# Patient Record
Sex: Male | Born: 1935 | Race: White | Hispanic: No | Smoking: Never smoker
Health system: Southern US, Community
[De-identification: ages and names within clinical notes are randomized; demographics above are authoritative.]

## PROBLEM LIST (undated history)

## (undated) DIAGNOSIS — R972 Elevated prostate specific antigen [PSA]: Secondary | ICD-10-CM

## (undated) DIAGNOSIS — E785 Hyperlipidemia, unspecified: Secondary | ICD-10-CM

## (undated) DIAGNOSIS — G459 Transient cerebral ischemic attack, unspecified: Secondary | ICD-10-CM

## (undated) DIAGNOSIS — N400 Enlarged prostate without lower urinary tract symptoms: Secondary | ICD-10-CM

## (undated) DIAGNOSIS — I4891 Unspecified atrial fibrillation: Secondary | ICD-10-CM

## (undated) DIAGNOSIS — C44300 Unspecified malignant neoplasm of skin of unspecified part of face: Secondary | ICD-10-CM

## (undated) DIAGNOSIS — G629 Polyneuropathy, unspecified: Secondary | ICD-10-CM

## (undated) DIAGNOSIS — R351 Nocturia: Secondary | ICD-10-CM

## (undated) DIAGNOSIS — H579 Unspecified disorder of eye and adnexa: Secondary | ICD-10-CM

## (undated) DIAGNOSIS — G309 Alzheimer's disease, unspecified: Secondary | ICD-10-CM

## (undated) DIAGNOSIS — M1990 Unspecified osteoarthritis, unspecified site: Secondary | ICD-10-CM

## (undated) DIAGNOSIS — N419 Inflammatory disease of prostate, unspecified: Secondary | ICD-10-CM

## (undated) DIAGNOSIS — M109 Gout, unspecified: Secondary | ICD-10-CM

## (undated) DIAGNOSIS — M5030 Other cervical disc degeneration, unspecified cervical region: Secondary | ICD-10-CM

## (undated) DIAGNOSIS — R32 Unspecified urinary incontinence: Secondary | ICD-10-CM

## (undated) DIAGNOSIS — F0280 Dementia in other diseases classified elsewhere without behavioral disturbance: Secondary | ICD-10-CM

## (undated) DIAGNOSIS — G4730 Sleep apnea, unspecified: Secondary | ICD-10-CM

## (undated) DIAGNOSIS — C61 Malignant neoplasm of prostate: Secondary | ICD-10-CM

## (undated) HISTORY — DX: Gout, unspecified: M10.9

## (undated) HISTORY — DX: Dementia in other diseases classified elsewhere, unspecified severity, without behavioral disturbance, psychotic disturbance, mood disturbance, and anxiety: F02.80

## (undated) HISTORY — DX: Inflammatory disease of prostate, unspecified: N41.9

## (undated) HISTORY — DX: Nocturia: R35.1

## (undated) HISTORY — DX: Sleep apnea, unspecified: G47.30

## (undated) HISTORY — DX: Unspecified osteoarthritis, unspecified site: M19.90

## (undated) HISTORY — DX: Malignant neoplasm of prostate: C61

## (undated) HISTORY — DX: Transient cerebral ischemic attack, unspecified: G45.9

## (undated) HISTORY — DX: Unspecified disorder of eye and adnexa: H57.9

## (undated) HISTORY — DX: Unspecified atrial fibrillation: I48.91

## (undated) HISTORY — DX: Hyperlipidemia, unspecified: E78.5

## (undated) HISTORY — DX: Unspecified malignant neoplasm of skin of unspecified part of face: C44.300

## (undated) HISTORY — DX: Unspecified urinary incontinence: R32

## (undated) HISTORY — DX: Benign prostatic hyperplasia without lower urinary tract symptoms: N40.0

## (undated) HISTORY — PX: HERNIA REPAIR: SHX51

## (undated) HISTORY — DX: Polyneuropathy, unspecified: G62.9

## (undated) HISTORY — DX: Other cervical disc degeneration, unspecified cervical region: M50.30

## (undated) HISTORY — DX: Elevated prostate specific antigen (PSA): R97.20

## (undated) HISTORY — DX: Alzheimer's disease, unspecified: G30.9

---

## 1991-03-31 HISTORY — PX: TRANSURETHRAL RESECTION OF PROSTATE: SHX73

## 2003-07-04 ENCOUNTER — Other Ambulatory Visit: Payer: Self-pay

## 2003-11-20 ENCOUNTER — Other Ambulatory Visit: Payer: Self-pay

## 2004-02-25 ENCOUNTER — Ambulatory Visit: Payer: Self-pay | Admitting: Ophthalmology

## 2004-03-03 ENCOUNTER — Ambulatory Visit: Payer: Self-pay | Admitting: Ophthalmology

## 2004-06-20 ENCOUNTER — Ambulatory Visit: Payer: Self-pay | Admitting: Ophthalmology

## 2004-06-30 ENCOUNTER — Ambulatory Visit: Payer: Self-pay | Admitting: Ophthalmology

## 2007-02-07 ENCOUNTER — Ambulatory Visit: Payer: Self-pay | Admitting: Family Medicine

## 2007-02-21 ENCOUNTER — Ambulatory Visit: Payer: Self-pay | Admitting: Family Medicine

## 2007-05-19 ENCOUNTER — Ambulatory Visit: Payer: Self-pay | Admitting: Rheumatology

## 2007-12-22 ENCOUNTER — Ambulatory Visit: Payer: Self-pay | Admitting: Family Medicine

## 2010-05-02 ENCOUNTER — Observation Stay: Payer: Self-pay | Admitting: Internal Medicine

## 2013-03-30 HISTORY — PX: MOHS SURGERY: SUR867

## 2013-05-31 ENCOUNTER — Ambulatory Visit: Payer: Self-pay | Admitting: Urology

## 2013-08-17 DIAGNOSIS — G909 Disorder of the autonomic nervous system, unspecified: Secondary | ICD-10-CM | POA: Insufficient documentation

## 2013-09-14 LAB — LIPID PANEL
Cholesterol: 156 mg/dL (ref 0–200)
HDL: 67 mg/dL (ref 35–70)
LDL Cholesterol: 74 mg/dL
TRIGLYCERIDES: 77 mg/dL (ref 40–160)

## 2013-09-14 LAB — HEMOGLOBIN A1C: Hgb A1c MFr Bld: 6.4 % — AB (ref 4.0–6.0)

## 2014-04-02 LAB — BASIC METABOLIC PANEL
BUN: 39 mg/dL — AB (ref 4–21)
Creatinine: 1.3 mg/dL (ref <=1.3)
GLUCOSE: 110 mg/dL
Potassium: 5.2 mmol/L (ref 3.4–5.3)
Sodium: 139 mmol/L (ref 137–147)

## 2014-04-02 LAB — HEPATIC FUNCTION PANEL
ALK PHOS: 84 U/L (ref 25–125)
ALT: 16 U/L (ref 10–40)
AST: 21 U/L (ref 14–40)
Bilirubin, Total: 0.3 mg/dL

## 2014-04-02 LAB — CBC AND DIFFERENTIAL
HCT: 42 % (ref 41–53)
Hemoglobin: 13.8 g/dL (ref 13.5–17.5)
NEUTROS ABS: 64 /uL
PLATELETS: 177 10*3/uL (ref 150–399)
WBC: 7.4 10^3/mL

## 2014-04-02 LAB — TSH: TSH: 2.68 u[IU]/mL (ref <=5.90)

## 2014-04-08 ENCOUNTER — Emergency Department: Payer: Self-pay | Admitting: Internal Medicine

## 2014-04-27 DIAGNOSIS — I482 Chronic atrial fibrillation, unspecified: Secondary | ICD-10-CM | POA: Insufficient documentation

## 2014-08-02 LAB — POCT INR: INR: 1.9 — AB (ref <=1.1)

## 2014-08-07 ENCOUNTER — Other Ambulatory Visit: Payer: Self-pay | Admitting: Family Medicine

## 2014-08-07 DIAGNOSIS — Z79899 Other long term (current) drug therapy: Secondary | ICD-10-CM

## 2014-08-09 ENCOUNTER — Ambulatory Visit
Admission: RE | Admit: 2014-08-09 | Discharge: 2014-08-09 | Disposition: A | Discharge status: Home / Self Care | Payer: Medicare PPO | Source: Ambulatory Visit | Attending: Family Medicine | Admitting: Family Medicine

## 2014-08-09 DIAGNOSIS — C61 Malignant neoplasm of prostate: Secondary | ICD-10-CM | POA: Insufficient documentation

## 2014-08-09 DIAGNOSIS — C4491 Basal cell carcinoma of skin, unspecified: Secondary | ICD-10-CM | POA: Insufficient documentation

## 2014-08-09 DIAGNOSIS — J309 Allergic rhinitis, unspecified: Secondary | ICD-10-CM | POA: Insufficient documentation

## 2014-08-09 DIAGNOSIS — C439 Malignant melanoma of skin, unspecified: Secondary | ICD-10-CM | POA: Insufficient documentation

## 2014-08-09 DIAGNOSIS — R2681 Unsteadiness on feet: Secondary | ICD-10-CM | POA: Insufficient documentation

## 2014-08-09 DIAGNOSIS — G609 Hereditary and idiopathic neuropathy, unspecified: Secondary | ICD-10-CM | POA: Insufficient documentation

## 2014-08-09 DIAGNOSIS — Z79899 Other long term (current) drug therapy: Secondary | ICD-10-CM | POA: Insufficient documentation

## 2014-08-09 DIAGNOSIS — I6782 Cerebral ischemia: Secondary | ICD-10-CM | POA: Insufficient documentation

## 2014-08-09 DIAGNOSIS — M9979 Connective tissue and disc stenosis of intervertebral foramina of abdomen and other regions: Secondary | ICD-10-CM | POA: Insufficient documentation

## 2014-08-09 DIAGNOSIS — G4730 Sleep apnea, unspecified: Secondary | ICD-10-CM | POA: Insufficient documentation

## 2014-08-09 DIAGNOSIS — R0902 Hypoxemia: Secondary | ICD-10-CM | POA: Insufficient documentation

## 2014-08-09 DIAGNOSIS — E7800 Pure hypercholesterolemia, unspecified: Secondary | ICD-10-CM | POA: Insufficient documentation

## 2014-08-09 DIAGNOSIS — I4891 Unspecified atrial fibrillation: Secondary | ICD-10-CM | POA: Insufficient documentation

## 2014-08-09 DIAGNOSIS — E538 Deficiency of other specified B group vitamins: Secondary | ICD-10-CM | POA: Insufficient documentation

## 2014-08-09 DIAGNOSIS — M8580 Other specified disorders of bone density and structure, unspecified site: Secondary | ICD-10-CM | POA: Diagnosis not present

## 2014-08-09 DIAGNOSIS — D126 Benign neoplasm of colon, unspecified: Secondary | ICD-10-CM | POA: Insufficient documentation

## 2014-08-09 DIAGNOSIS — N400 Enlarged prostate without lower urinary tract symptoms: Secondary | ICD-10-CM | POA: Insufficient documentation

## 2014-08-09 DIAGNOSIS — G939 Disorder of brain, unspecified: Secondary | ICD-10-CM | POA: Insufficient documentation

## 2014-08-09 DIAGNOSIS — M1990 Unspecified osteoarthritis, unspecified site: Secondary | ICD-10-CM | POA: Insufficient documentation

## 2014-09-17 ENCOUNTER — Telehealth: Payer: Self-pay | Admitting: Family Medicine

## 2014-09-17 NOTE — Telephone Encounter (Signed)
[name] have you seen this on the patient?-aa

## 2014-09-17 NOTE — Telephone Encounter (Signed)
Pt's daughter Pamala Hurry would like to get the results of Pt's PT Test that was done on 09/05/14. Daughter stated she nor pt had got the results yet. Thanks TNP

## 2014-09-18 ENCOUNTER — Telehealth: Payer: Self-pay | Admitting: Family Medicine

## 2014-09-18 NOTE — Telephone Encounter (Signed)
Ivin Booty advised the lab slip is ready.-aa

## 2014-09-18 NOTE — Telephone Encounter (Signed)
Pt's daughter Ivin Booty would like to pick up orders for pt to have his PT check. She would like to pick up the orders this afternoon to take him Thursday. I advised they might not be ready by this afternoon. Ivin Booty would like a nurse to return her call. Thanks TNP

## 2014-09-19 ENCOUNTER — Encounter: Payer: Self-pay | Admitting: Family Medicine

## 2014-09-25 NOTE — Telephone Encounter (Signed)
Pt called to request lab results.  CB#[phone]/MJ

## 2014-09-25 NOTE — Telephone Encounter (Signed)
Spoke with Dr. Venia Minks and she reviewed his current INR result which is at 1.8 was 1.7 in May. Advised to take coumadin MWF 2 mg and all other day 1 mg, re check in 2 weeks. Patient wrote this down and understood.

## 2014-09-26 ENCOUNTER — Encounter: Payer: Self-pay | Admitting: Family Medicine

## 2014-09-27 ENCOUNTER — Telehealth: Payer: Self-pay | Admitting: Family Medicine

## 2014-09-27 NOTE — Telephone Encounter (Signed)
Pamala Hurry advised, see other message in the chart=aa

## 2014-09-27 NOTE — Telephone Encounter (Signed)
Pt's daughter would like a call back to get the lab results from 09/20/14. Thanks TNP

## 2014-10-08 ENCOUNTER — Ambulatory Visit: Payer: Self-pay | Admitting: Family Medicine

## 2014-10-09 ENCOUNTER — Other Ambulatory Visit: Payer: Self-pay | Admitting: Neurology

## 2014-10-09 DIAGNOSIS — R410 Disorientation, unspecified: Secondary | ICD-10-CM

## 2014-10-10 ENCOUNTER — Other Ambulatory Visit: Payer: Self-pay | Admitting: Family Medicine

## 2014-10-11 LAB — PROTIME-INR
INR: 1.4 — AB (ref 0.8–1.2)
Prothrombin Time: 14.3 s — ABNORMAL HIGH (ref 9.1–12.0)

## 2014-10-15 ENCOUNTER — Telehealth: Payer: Self-pay | Admitting: Family Medicine

## 2014-10-15 NOTE — Progress Notes (Signed)
Pt advised and daughter-Barbara advised-aa

## 2014-10-15 NOTE — Telephone Encounter (Signed)
Pt wants Lab results from his pt test.  Please call back .[phone]  Thanks  tp

## 2014-10-18 ENCOUNTER — Ambulatory Visit
Admission: RE | Admit: 2014-10-18 | Discharge: 2014-10-18 | Disposition: A | Discharge status: Home / Self Care | Payer: Medicare PPO | Source: Ambulatory Visit | Attending: Neurology | Admitting: Neurology

## 2014-10-18 DIAGNOSIS — F0280 Dementia in other diseases classified elsewhere without behavioral disturbance: Secondary | ICD-10-CM | POA: Insufficient documentation

## 2014-10-18 DIAGNOSIS — R410 Disorientation, unspecified: Secondary | ICD-10-CM

## 2014-10-18 DIAGNOSIS — G301 Alzheimer's disease with late onset: Secondary | ICD-10-CM | POA: Insufficient documentation

## 2014-10-18 DIAGNOSIS — I679 Cerebrovascular disease, unspecified: Secondary | ICD-10-CM | POA: Insufficient documentation

## 2014-10-18 MED ORDER — GADOBENATE DIMEGLUMINE 529 MG/ML IV SOLN
10.0000 mL | Freq: Once | INTRAVENOUS | Status: AC | PRN
  Administered 2014-10-18: 7 mL via INTRAVENOUS

## 2014-10-29 ENCOUNTER — Ambulatory Visit: Payer: Self-pay | Admitting: Family Medicine

## 2014-10-30 ENCOUNTER — Other Ambulatory Visit: Payer: Self-pay | Admitting: Family Medicine

## 2014-10-31 LAB — PROTIME-INR
INR: 2.7 — ABNORMAL HIGH (ref 0.8–1.2)
PROTHROMBIN TIME: 27.6 s — AB (ref 9.1–12.0)

## 2014-11-06 ENCOUNTER — Telehealth: Payer: Self-pay | Admitting: Family Medicine

## 2014-11-06 NOTE — Telephone Encounter (Signed)
Pt's daughter said that she was returning Elena's call and that she should be available for the rest of the afternoon for a call back. Thanks TNP

## 2014-11-06 NOTE — Telephone Encounter (Signed)
Pt informed and voiced understanding of results. 

## 2014-11-06 NOTE — Telephone Encounter (Signed)
-----   Message from Jerrol Banana., MD sent at 11/05/2014  9:46 AM EDT ----- Level good. Same dose. Repeat one month.

## 2014-11-06 NOTE — Telephone Encounter (Signed)
Spoke with pt daughter informed her of results.

## 2014-11-06 NOTE — Telephone Encounter (Signed)
LMTCB with daughter Pamala Hurry ED

## 2014-11-19 ENCOUNTER — Telehealth: Payer: Self-pay | Admitting: Family Medicine

## 2014-11-19 NOTE — Telephone Encounter (Signed)
Pt daughter Pamala Hurry called stating pt has a standing order to have lab work with [facility].  Pt daughter is requesting this order changed from [place]  to [email or web address]  [phone].  TI#[phone]/QP

## 2014-11-20 NOTE — Telephone Encounter (Signed)
ok 

## 2014-11-20 NOTE — Telephone Encounter (Signed)
[name] Do you want this to be done this way.  Is he still independent living or is it because that changed.  ED

## 2014-11-21 NOTE — Telephone Encounter (Signed)
Done and daughter Rhae Lerner

## 2014-11-22 ENCOUNTER — Telehealth: Payer: Self-pay | Admitting: Emergency Medicine

## 2014-11-22 ENCOUNTER — Encounter: Payer: Self-pay | Admitting: Family Medicine

## 2014-11-22 NOTE — Telephone Encounter (Signed)
Tried to call pt about warfarin dosage. Need to know what dosage he is taking and then increase warfarin by 2 mg a week. Pay attention to how pt is acting as far as memory. We may need to talk with daughter about the benefits of him taking this and his memory and the risk of him falling, taking too much etc. See notes on my desk.

## 2014-11-23 NOTE — Telephone Encounter (Signed)
Spoke with Pamala Hurry, Pt daughter. She reports that pt does better if we can keep him on a consistant dose. I told about maybe time to start discussing the risk and benefits of coming off the Warfarin. She wants to know if he can just keep taking this dose he is on even though it is a lower than target as some protection for his Afib. They are trying to keep him on routine because he does better when he does not have changes in anything. In other words just stay taking the 2 mg daily even though it is out of range for now until the time comes to do something different with his medication routine all together.

## 2014-11-24 NOTE — Telephone Encounter (Signed)
[place] but make appt for sometime this month.

## 2014-11-26 NOTE — Telephone Encounter (Signed)
Pamala Hurry, daughter, advised and appt made-aa

## 2014-11-27 ENCOUNTER — Telehealth: Payer: Self-pay | Admitting: Family Medicine

## 2014-11-27 NOTE — Telephone Encounter (Signed)
Spoke with Caryl Pina regarding the note below;  [place] had called pt's daughter Pamala Hurry regarding to continue the same  dose and will be following up in a month, Caryl Pina from [facility] is requesting to let her know when to recheck the PT/INR if when the pt comes for his appointment at [facility] or at the [facility]. And if at [facility] on his next appointment when will she be checking it?   Please advise..  Thanks,

## 2014-11-27 NOTE — Telephone Encounter (Signed)
Caryl Pina with [facility] called because she faxed over pt's PT/INR results last week and hasn't heard from our office if we wanted to make changes to his medication. PT/INR  19.4/1.64 on 11/22/14. Please advise when they need to recheck. Thanks TNP

## 2014-11-27 NOTE — Telephone Encounter (Signed)
Left message to inform Kerry [name] that we will be discussing options with family at appt on 12/10/14 with this medication and the risk and benefits of staying on it and the mental status of pt.

## 2014-11-28 ENCOUNTER — Encounter: Payer: Self-pay | Admitting: *Deleted

## 2014-11-30 ENCOUNTER — Other Ambulatory Visit: Payer: Self-pay | Admitting: Family Medicine

## 2014-11-30 ENCOUNTER — Telehealth: Payer: Self-pay | Admitting: Family Medicine

## 2014-11-30 DIAGNOSIS — I4891 Unspecified atrial fibrillation: Secondary | ICD-10-CM

## 2014-11-30 MED ORDER — WARFARIN SODIUM 2 MG PO TABS: 2.0000 mg | ORAL_TABLET | Freq: Once | ORAL | Status: DC

## 2014-11-30 NOTE — Telephone Encounter (Signed)
Pt advised, INR was 1.6 that was drawn through [facility] and daughter was advised also of results, will not make any changes per daughters request and will follow up on September 12th still-aa

## 2014-11-30 NOTE — Telephone Encounter (Signed)
Patient's last INR was 2.7 on 11/01/14. Patient has an appt scheduled with [name] on 12/10/14.

## 2014-11-30 NOTE — Telephone Encounter (Signed)
Pt is requesting lab results.  CB#[phone]/MW

## 2014-11-30 NOTE — Telephone Encounter (Signed)
RX sent in see other message-aa

## 2014-11-30 NOTE — Telephone Encounter (Signed)
Pt contacted office for refill request on the following medications:  warfarin (COUMADIN) 2 MG.  [facility].  CB#[phone]/Barbara/MW   Pt is completely out and is requesting this sent to the local pharmacy/MW  The is a pt of Dr Wilmon Arms

## 2014-12-10 ENCOUNTER — Ambulatory Visit (INDEPENDENT_AMBULATORY_CARE_PROVIDER_SITE_OTHER): Payer: Medicare PPO | Admitting: Urology

## 2014-12-10 ENCOUNTER — Ambulatory Visit (INDEPENDENT_AMBULATORY_CARE_PROVIDER_SITE_OTHER): Payer: Medicare PPO | Admitting: Family Medicine

## 2014-12-10 ENCOUNTER — Encounter: Payer: Self-pay | Admitting: Urology

## 2014-12-10 ENCOUNTER — Encounter: Payer: Self-pay | Admitting: Family Medicine

## 2014-12-10 VITALS — BP 117/61 | HR 63 | Ht 66.5 in | Wt 149.7 lb

## 2014-12-10 VITALS — BP 138/72 | HR 72 | Resp 16 | Wt 151.0 lb

## 2014-12-10 DIAGNOSIS — C61 Malignant neoplasm of prostate: Secondary | ICD-10-CM | POA: Diagnosis not present

## 2014-12-10 DIAGNOSIS — Z23 Encounter for immunization: Secondary | ICD-10-CM

## 2014-12-10 DIAGNOSIS — I4891 Unspecified atrial fibrillation: Secondary | ICD-10-CM | POA: Diagnosis not present

## 2014-12-10 DIAGNOSIS — C7951 Secondary malignant neoplasm of bone: Secondary | ICD-10-CM

## 2014-12-10 DIAGNOSIS — R413 Other amnesia: Secondary | ICD-10-CM | POA: Diagnosis not present

## 2014-12-10 NOTE — Progress Notes (Signed)
12/10/2014 8:55 AM   Kerry [name] 12-Oct-1918 [id number]  Referring provider: Jerrol Banana., MD [address] [place], [place] [address]  Chief Complaint  Patient presents with  . Prostate Cancer    4 month check up    HPI: Patient is a 81 year old white male with prostate cancer who is on intermittent ADT therapy. He initially presented with a PSA level of 61.7 ng/mL. His bone scan completed on 05/31/2013 noted a lesion in T4 which was suspicious for metastases. He underwent four injections of Firmagon. His last PSA was 0.4 ng/mL on 07/30/2014 and his last serum testosterone was 25 ng/dL on 07/30/2014. He is taking his calcium supplements and Vitamin D.  Patient underwent a DEXA scan on 08/09/2014 and was found to have osteopenia.    At the visit on 04/02/2014, Kerry [name] was having a fair amount of fatigue and he wasn't able to engage in the activities that he enjoys. We had a long discussion with him and his daughter and decided to stop the ADT therapy and see if his fatigue improved.   Over the last few months, he states his fatigue has improved.  He is able to play golf and spend time with his wife.  He is a little frustrated at this time because he recently sold his vehicle.  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     PMH: Past Medical History  Diagnosis Date  . Nocturia   . Neuropathy   . Incontinence   . Osteoarthritis   . Prostatitis   . Skin cancer of face   . Sleep apnea   . TIA (transient ischemic attack)   . HLD (hyperlipidemia)   . Gout   . Eye lesion   . DDD (degenerative disc disease), cervical   . BPH (benign prostatic hyperplasia)   . A-fib   . Prostate cancer   . Elevated PSA     Surgical History: Past Surgical History  Procedure Laterality Date  . Transurethral resection of prostate  1993    Dr. Eliberto Ivory  . Hernia repair    . Mohs surgery  2015    skin cancer surgery on  the face and neck    Home Medications:    Medication List       This list is accurate as of: 12/10/14  8:55 AM.  Always use your most recent med list.               calcium carbonate 600 MG Tabs tablet  Commonly known as:  OS-CAL  Take by mouth.     cyanocobalamin 1000 MCG tablet  Take by mouth.     folic acid 967 MCG tablet  Commonly known as:  FOLVITE  Take by mouth.     leuprolide 11.25 MG injection  Commonly known as:  LUPRON  Inject into the muscle.     LORazepam 1 MG tablet  Commonly known as:  ATIVAN  TAKE 1 TAB 60 MIN BEFORE MRI AND SECOND PILL 15 MIN BEFORE MRI IF NEEDED FOR ANXIETY. (DO NOT DRIVE)     PRESCRIPTION MEDICATION  Avastin injection for macular degeneration. Does not know dose     PRESERVISION AREDS PO  Take by mouth.     warfarin 2 MG tablet  Commonly known as:  COUMADIN  TAKE 1 TABLET ONE TIME DAILY        Allergies:  Allergies  Allergen  Reactions  . Cephalexin Itching    Family History: Family History  Problem Relation Age of Onset  . Pancreatic cancer Mother   . Throat cancer Father   . CAD Father   . Heart attack Father   . Diabetes Sister   . Heart disease Father   . Kidney disease Neg Hx   . Prostate cancer      half uncle    Social History:  reports that he has never smoked. He does not have any smokeless tobacco history on file. He reports that he does not drink alcohol or use illicit drugs.  ROS: UROLOGY Frequent Urination?: No Hard to postpone urination?: No Burning/pain with urination?: No Get up at night to urinate?: No Leakage of urine?: Yes Urine stream starts and stops?: No Trouble starting stream?: No Do you have to strain to urinate?: No Blood in urine?: No Urinary tract infection?: No Sexually transmitted disease?: No Injury to kidneys or bladder?: No Painful intercourse?: No Weak stream?: No Erection problems?: No Penile pain?: No  Gastrointestinal Nausea?: No Vomiting?:  No Indigestion/heartburn?: No Diarrhea?: No Constipation?: No  Constitutional Fever: No Night sweats?: No Weight loss?: No Fatigue?: No  Skin Skin rash/lesions?: No Itching?: No  Eyes Blurred vision?: No Double vision?: No  Ears/Nose/Throat Sore throat?: No Sinus problems?: No  Hematologic/Lymphatic Swollen glands?: No Easy bruising?: No  Cardiovascular Leg swelling?: No Chest pain?: No  Respiratory Cough?: No Shortness of breath?: No  Endocrine Excessive thirst?: No  Musculoskeletal Back pain?: No Joint pain?: No  Neurological Headaches?: No Dizziness?: No  Psychologic Depression?: No Anxiety?: No  Physical Exam: BP 117/61 mmHg  Pulse 63  Ht 5' 6.5" (1.689 m)  Wt 149 lb 11.2 oz (67.903 kg)  BMI 23.80 kg/m2   Laboratory Data: Lab Results  Component Value Date   WBC 7.4 04/02/2014   HGB 13.8 04/02/2014   HCT 42 04/02/2014   PLT 177 04/02/2014    Lab Results  Component Value Date   CREATININE 1.3 04/02/2014   Lab Results  Component Value Date   HGBA1C 6.4* 09/14/2013   Pertinent Imaging: EXAM: DUAL X-RAY ABSORPTIOMETRY (DXA) FOR BONE MINERAL DENSITY  IMPRESSION: Dear [name],  Your patient Kerry [name] completed a BMD test on 08/09/2014 using the [place] (analysis version: 14.10) manufactured by [employer]. The following summarizes the results of our evaluation.  PATIENT BIOGRAPHICAL: Name: Kerry [name], Kerry [name] Patient ID: [id number] Birth Date: 09-30-1918 Height: 63.0 in. Gender: Male Exam Date: 08/09/2014 Weight: 152.0 lbs. Indications: Advanced Age, Caucasian, Height Loss, On High Risk Meds, Prostate ca  Treatments: CALCIUM VIT D, lupron injections, simvastatin, warfin  ASSESSMENT:  The BMD measured at Forearm Radius 33% is 0.802 g/cm2 with a T-score of -1.9. This patient is considered osteopenic by World Health Organization [facility]) Criteria. Lumbar spine was not utilized due to advanced  degenerative changes. Patient is not eligible for FRAX due to age over 61.  Site Region Measured Measured WHO Young Adult BMD Date Age Classification T-score DualFemur Neck Left 08/09/2014 93.8 Osteopenia -1.6 0.856 g/cm2  Left Forearm Radius 33% 08/09/2014 93.8 Osteopenia -1.9 0.802 g/cm2  World Health Organization [facility]) criteria for post-menopausal, Caucasian Women: Normal: T-score at or above -1 SD Osteopenia: T-score between -1 and -2.5 SD Osteoporosis: T-score at or below -2.5 SD  RECOMMENDATIONS: [place] recommends that FDA-approved medical therapies be considered in postmenopausal women and men age 7 or older with a: 1. Hip or vertebral (clinical or morphometric) fracture.  2. T-score of < -2.5 at the spine or hip. 3. Ten-year fracture probability by FRAX of 3% or greater for hip fracture or 20% or greater for major osteoporotic fracture.  All treatment decisions require clinical judgment and consideration of individual patient factors, including patient preferences, co-morbidities, previous drug use, risk factors not captured in the FRAX model (e.g. falls, vitamin D deficiency, increased bone turnover, interval significant decline in bone density) and possible under - or over-estimation of fracture risk by FRAX.  All patients should ensure an adequate intake of dietary calcium (1200 mg/d) and vitamin D (800 IU daily) unless contraindicated.  FOLLOW-UP: People with diagnosed cases of osteoporosis or osteopenia should be regularly tested for bone mineral density. For patients eligible for Medicare, routine testing is allowed once every 2 years. The testing frequency can be increased to one year for patients who have rapidly progressing disease, or for those who are receiving medical therapy to restore bone mass.  I have reviewed this report, and agree with the above findings.  [facility]  Radiology   Electronically Signed  By: Earle Gell M.D.  On: 08/09/2014 14:50  Assessment & Plan:    1. Prostate cancer:   Patient with a clinical diagnosis of prostate cancer.  No biopsy was performed.  Area of suspicion for metastasis in the T4 vertebrae on bone scan completed on 05/31/2013.  He is currently not receiving ADT therapy as it diminished his quality of life.  PSA and serum Testosterone are drawn today.  Daughter does not want to reinstate the ADT therapy, but she would like a prognosis.  I explained to her that pending the blood work, we could perform imaging studies that may help [place] predict the course of the disease.    - PSA - Testosterone   No Follow-up on file.  Zara Council, [facility] [address], [place], [place] [address] [phone]

## 2014-12-10 NOTE — Progress Notes (Signed)
Patient ID: Kerry [name], male   DOB: [birth year], 81 y.o.   MRN: [id number]    Subjective:  HPI Pt is here for  Follow for A fib and we need to discuss how to manage this due to patients memory issues. Last PT/INR was 11/01/14 and he gets this checked through [facility].  Daughter would like to keep pt at the same dose of the Warfarin if possible because when he has to switch it all the time, he gets more confused.   For memory loss, he has tried Aricept and it cause him to have anxiety and daughter reports that it was not helping and was not worth him feeling so anxious.  Prior to Admission medications   Medication Sig Start Date End Date Taking? Authorizing Provider  calcium carbonate (OS-CAL) 600 MG TABS tablet Take by mouth.   Yes Historical Provider, MD  cyanocobalamin 1000 MCG tablet Take by mouth. 10/03/12  Yes Historical Provider, MD  folic acid (FOLVITE) 416 MCG tablet Take by mouth.   Yes Historical Provider, MD  leuprolide (LUPRON) 11.25 MG injection Inject into the muscle.   Yes Historical Provider, MD  LORazepam (ATIVAN) 1 MG tablet TAKE 1 TAB 60 MIN BEFORE MRI AND SECOND PILL 15 MIN BEFORE MRI IF NEEDED FOR ANXIETY. (DO NOT DRIVE) 09/02/28  Yes Historical Provider, MD  Multiple Vitamins-Minerals (PRESERVISION AREDS PO) Take by mouth.   Yes Historical Provider, MD  Multiple Vitamins-Minerals (PRESERVISION/LUTEIN PO) Take by mouth.   Yes Historical Provider, MD  PRESCRIPTION MEDICATION Avastin injection for macular degeneration. Does not know dose   Yes Historical Provider, MD  warfarin (COUMADIN) 2 MG tablet TAKE 1 TABLET ONE TIME DAILY 11/30/14  Yes Jerrol Banana., MD    Patient Active Problem List   Diagnosis Date Noted  . Prostate cancer metastatic to bone 12/10/2014  . Memory loss 12/10/2014  . Adenocarcinoma of prostate 08/09/2014  . Allergic rhinitis 08/09/2014  . Arthritis 08/09/2014  . A-fib 08/09/2014  . Basal cell carcinoma 08/09/2014  . Benign fibroma of  prostate 08/09/2014  . Benign neoplasm of colon 08/09/2014  . Narrowing of intervertebral disc space 08/09/2014  . General unsteadiness 08/09/2014  . Hypercholesteremia 08/09/2014  . Hypoxia 08/09/2014  . Malignant melanoma 08/09/2014  . Idiopathic peripheral neuropathy 08/09/2014  . Arthritis, degenerative 08/09/2014  . Apnea, sleep 08/09/2014  . Temporary cerebral vascular dysfunction 08/09/2014  . B12 deficiency 08/09/2014  . Disorder of peripheral autonomic nervous system 08/17/2013    Past Medical History  Diagnosis Date  . Nocturia   . Neuropathy   . Incontinence   . Osteoarthritis   . Prostatitis   . Skin cancer of face   . Sleep apnea   . TIA (transient ischemic attack)   . HLD (hyperlipidemia)   . Gout   . Eye lesion   . DDD (degenerative disc disease), cervical   . BPH (benign prostatic hyperplasia)   . A-fib   . Prostate cancer   . Elevated PSA   . Alzheimer disease     Social History   Social History  . Marital Status: Married    Spouse Name: N/A  . Number of Children: N/A  . Years of Education: N/A   Occupational History  . Not on file.   Social History Main Topics  . Smoking status: Never Smoker   . Smokeless tobacco: Not on file  . Alcohol Use: No  . Drug Use: No  . Sexual Activity: Not on file  Other Topics Concern  . Not on file   Social History Narrative    Allergies  Allergen Reactions  . Aricept [Donepezil]     Anxiety   . Cephalexin Itching    Review of Systems  Constitutional: Negative.   HENT: Negative.   Eyes: Negative.   Respiratory: Negative.   Cardiovascular: Negative.   Gastrointestinal: Negative.   Genitourinary: Negative.   Musculoskeletal: Negative.   Skin: Negative.   Neurological: Negative.   Endo/Heme/Allergies: Negative.   Psychiatric/Behavioral: Positive for memory loss.    Immunization History  Administered Date(s) Administered  . Pneumococcal Conjugate-13 06/11/2014  . Pneumococcal  Polysaccharide-23 11/07/1992  . Td 10/05/1991, 07/04/2014   Objective:  BP 138/72 mmHg  Pulse 72  Resp 16  Wt 151 lb (68.493 kg)  Physical Exam  Constitutional: He is oriented to person, place, and time and well-developed, well-nourished, and in no distress.  HENT:  Head: Normocephalic and atraumatic.  Right Ear: External ear normal.  Left Ear: External ear normal.  Nose: Nose normal.  Eyes: Conjunctivae are normal.  Neck: Neck supple.  Cardiovascular: Normal rate, regular rhythm and normal heart sounds.   Pulmonary/Chest: Effort normal and breath sounds normal.  Abdominal: Soft.  Neurological: He is alert and oriented to person, place, and time. He exhibits abnormal muscle tone.  Skin: Skin is warm and dry.  Psychiatric: Mood, memory and affect normal.  Patient is obviously starting to have some cognitive issues. Discussed his wife and he repeated the same story twice and a 5 minute timeframe.    Lab Results  Component Value Date   WBC 7.4 04/02/2014   HGB 13.8 04/02/2014   HCT 42 04/02/2014   PLT 177 04/02/2014   CHOL 156 09/14/2013   TRIG 77 09/14/2013   HDL 67 09/14/2013   LDLCALC 74 09/14/2013   TSH 2.68 04/02/2014   INR 2.7* 10/30/2014   HGBA1C 6.4* 09/14/2013    CMP     Component Value Date/Time   NA 139 04/02/2014   K 5.2 04/02/2014   BUN 39* 04/02/2014   CREATININE 1.3 04/02/2014   AST 21 04/02/2014   ALT 16 04/02/2014   ALKPHOS 84 04/02/2014    Assessment and Plan :  1. Atrial fibrillation, unspecified Cognition is the issue with coumadin. Daughter suggests same daily dose. Discussed with her it might be of some benefit if INR is 1.4-1.8 instead of 2-3. It is reasonable as his cognition is much worse and the possibility of dangerously higher INR with confusion over hoiw to dose exists. Consider newer agents on next visit.  2. Memory loss Definjitely progressing. Alzheimers most likely.Probably needs assisted living--still independent I think.  3.  Need for influenza vaccination  - Flu vaccine HIGH DOSE PF Time spent in counselling more than 50% of visit.  Miguel Aschoff MD [facility] 12/10/2014 11:38 AM

## 2014-12-11 LAB — PSA: PROSTATE SPECIFIC AG, SERUM: 1.7 ng/mL (ref 0.0–4.0)

## 2014-12-11 LAB — TESTOSTERONE: Testosterone: 58 ng/dL — ABNORMAL LOW (ref 348–1197)

## 2014-12-12 ENCOUNTER — Telehealth: Payer: Self-pay | Admitting: Family Medicine

## 2014-12-12 NOTE — Telephone Encounter (Signed)
Please see below, what was decided in regards to this?-aa

## 2014-12-12 NOTE — Telephone Encounter (Signed)
Caryl Pina wanted to know when she should do pt's next PT/INR and if we were going to continue with the Coumadin. Please TNP

## 2014-12-13 NOTE — Telephone Encounter (Signed)
His daughter called today with the same questions of his pt/inr testing.  She wants to know if is going to be a standing order every month there for him to have done  Her call back is [phone].  Her name is Pamala Hurry  Thanks Con Memos

## 2014-12-13 NOTE — Telephone Encounter (Signed)
Please review-aa 

## 2014-12-14 ENCOUNTER — Other Ambulatory Visit: Payer: Self-pay | Admitting: Family Medicine

## 2014-12-14 LAB — PROTIME-INR
INR: 2.8 — ABNORMAL HIGH (ref 0.8–1.2)
Prothrombin Time: 28.2 s — ABNORMAL HIGH (ref 9.1–12.0)

## 2014-12-14 NOTE — Telephone Encounter (Signed)
Stay with 2 mg dose. PT in one month.

## 2014-12-14 NOTE — Telephone Encounter (Signed)
Spoke with [facility], informed them of the standing orders for PT. She said that he is taking 2 mg daily and is due for a PT recheck on 12/20/14, if we are doing it once a month as long as it is not too high. Is this correct?  Also spoke with daughter and she is aware and verified that he takes 2 mg daily.

## 2014-12-14 NOTE — Telephone Encounter (Signed)
Trying to simplify dose--what is present dose? Standing order PT ok with me--probably already there.

## 2014-12-17 ENCOUNTER — Telehealth: Payer: Self-pay | Admitting: Family Medicine

## 2014-12-17 DIAGNOSIS — I482 Chronic atrial fibrillation, unspecified: Secondary | ICD-10-CM

## 2014-12-17 NOTE — Telephone Encounter (Signed)
Kerry [name] with [facility] is requesting a call back.  EB#[phone]/HF

## 2014-12-17 NOTE — Telephone Encounter (Signed)
Advised  ED 

## 2014-12-21 ENCOUNTER — Encounter: Payer: Self-pay | Admitting: Family Medicine

## 2014-12-24 NOTE — Telephone Encounter (Signed)
-----   Message from Jerrol Banana., MD sent at 12/23/2014  8:50 AM EDT ----- INR good but I am concerned about pt cognition. Remind me we need to talk to daughter.

## 2014-12-24 NOTE — Telephone Encounter (Signed)
The patient has been advised of the lab results.  I have attempted to call the daughter, Pamala Hurry as we have been giving her the information also.  I left her a message letting her know it was ok but that you had some concerns about her father.  I briefly stated that the concerns involved him using the Coumadin and that you felt we may need to make changes.  I let her know she could cal [place] back and if you were able to at the time you would speak with her, otherwise we could take the message and have you call her back.   ED

## 2014-12-24 NOTE — Telephone Encounter (Signed)
Remind me tomorrow morning and I will try to do that.

## 2014-12-24 NOTE — Telephone Encounter (Signed)
Pt daughter, Pamala Hurry is returning call.  CB#[phone]/WG

## 2014-12-24 NOTE — Telephone Encounter (Signed)
Daughter called see below, I was not clear on the message if you wanted to talk to her personally about patient. I did not know what concerns specifically you had and wanted to do. Thank you-aa

## 2014-12-25 NOTE — Telephone Encounter (Signed)
Dr. Darnell Level, did you still want to call patient's daughter?

## 2014-12-26 ENCOUNTER — Telehealth: Payer: Self-pay | Admitting: Family Medicine

## 2014-12-26 NOTE — Telephone Encounter (Signed)
Advised daughter, Pamala Hurry, that I'm very concerned about his dementia. Advised to cut back on the Coumadin to 1 mg daily and to recheck a PT/INR in 1 week. I am concerned that the benefits are now being outpatient by the risk of being on a blood thinner because of his dementia.

## 2014-12-26 NOTE — Telephone Encounter (Signed)
Kerry [name] request a call back to discuss labs.  FH#[phone]/QD

## 2014-12-26 NOTE — Telephone Encounter (Signed)
Kerry [name] as below-aa

## 2014-12-26 NOTE — Telephone Encounter (Signed)
Will call back as soon as [name] talks to the daughter about patient and PT.-aa

## 2014-12-26 NOTE — Telephone Encounter (Signed)
LMTCB-aa spoke with [name]. Will decrease coumadin to 1 mg and re check PT in 1 week. Daughter is aware of this e-aa

## 2015-01-01 ENCOUNTER — Encounter: Payer: Self-pay | Admitting: Family Medicine

## 2015-01-24 ENCOUNTER — Telehealth: Payer: Self-pay | Admitting: *Deleted

## 2015-01-24 NOTE — Telephone Encounter (Signed)
Pamala Hurry was notified. Expressed understanding.

## 2015-01-24 NOTE — Telephone Encounter (Signed)
Called pt's daughter Pamala Hurry with pt/inr results. LMOVM for Pamala Hurry to return call. Per fisher PT/INR 1.65/19.4 was a little low. Patient needs to take 2 tablets for one day and then resume 1 tablet qd. Recheck in 2 weeks.

## 2015-02-11 ENCOUNTER — Ambulatory Visit: Payer: Medicare PPO | Admitting: Family Medicine

## 2015-02-12 ENCOUNTER — Telehealth: Payer: Self-pay | Admitting: Family Medicine

## 2015-02-12 ENCOUNTER — Ambulatory Visit: Payer: Medicare PPO | Admitting: Family Medicine

## 2015-02-12 NOTE — Telephone Encounter (Signed)
LMTCB for Kerry [name] at [facility]. ED

## 2015-02-12 NOTE — Telephone Encounter (Signed)
Kerry [name] with [facility] is requesting a call back from [place].  CB#[phone]/MW

## 2015-02-14 ENCOUNTER — Telehealth: Payer: Self-pay | Admitting: Family Medicine

## 2015-02-14 NOTE — Telephone Encounter (Signed)
Orson Slick with [facility] states pt is currently on 2mg  of coumadin a day.  Pt PT is 16.4 and INR is 1.30.  CB#[phone]/MW

## 2015-02-15 NOTE — Telephone Encounter (Signed)
Change to 3mg  every Monday, Wednesday, and Friday, 2 mg all other days. Recheck PT/INR in 2 weeks.

## 2015-02-15 NOTE — Telephone Encounter (Signed)
Please review, Dr. Gilbert's patient-aa 

## 2015-02-15 NOTE — Telephone Encounter (Signed)
L/M stating below. Advised to call office if any additional questions or concerns.

## 2015-03-19 ENCOUNTER — Telehealth: Payer: Self-pay | Admitting: Radiology

## 2015-03-19 NOTE — Telephone Encounter (Signed)
Kerry [name] called asking about the results of his last PSA and asked you to call him back at [phone]. He is concerned about the status of his prostate cancer.

## 2015-03-20 NOTE — Telephone Encounter (Signed)
I tried to contact the patient, but I had to leave a message.  He is concerned about his PSA.  It is stable.  He needs a follow up appointment in January for a repeat PSA and exam.

## 2015-03-21 ENCOUNTER — Encounter: Payer: Self-pay | Admitting: Family Medicine

## 2015-03-21 NOTE — Telephone Encounter (Signed)
Notified pt his PSA is stable & made lab appt on 1/9 @1 :30 & appt with Larene Beach on 1/16 @1 :79. Pt voices understanding.

## 2015-04-08 ENCOUNTER — Other Ambulatory Visit: Payer: Medicare PPO

## 2015-04-10 ENCOUNTER — Other Ambulatory Visit: Payer: Medicare PPO

## 2015-04-10 DIAGNOSIS — C61 Malignant neoplasm of prostate: Secondary | ICD-10-CM

## 2015-04-11 LAB — PSA: Prostate Specific Ag, Serum: 2.6 ng/mL (ref 0.0–4.0)

## 2015-04-15 ENCOUNTER — Encounter: Payer: Self-pay | Admitting: Urology

## 2015-04-15 ENCOUNTER — Ambulatory Visit (INDEPENDENT_AMBULATORY_CARE_PROVIDER_SITE_OTHER): Payer: Medicare PPO | Admitting: Urology

## 2015-04-15 VITALS — BP 109/64 | HR 66 | Ht 68.0 in | Wt 155.5 lb

## 2015-04-15 DIAGNOSIS — C61 Malignant neoplasm of prostate: Secondary | ICD-10-CM

## 2015-04-15 NOTE — Progress Notes (Signed)
3:00 PM   Kerry [name] 1918/12/30 [id number]  Referring provider: Jerrol [name]., MD [address] [place], [place] [address]  Chief Complaint  Patient presents with  . Prostate Cancer    follow up    HPI: Patient is a 82 year old Caucasian male with stage IV prostate cancer who is on intermittent ADT therapy who presents today for a 4 month follow-up.  Previous history Patient is a 82 year old white male with prostate cancer who is on intermittent ADT therapy. He initially presented with a PSA level of 61.7 ng/mL. His bone scan completed on 05/31/2013 noted a lesion in T4 which was suspicious for metastases. He underwent four injections of Firmagon. His last PSA was 0.4 ng/mL on 07/30/2014 and his last serum testosterone was 25 ng/dL on 07/30/2014. He is taking his calcium supplements and Vitamin D.  Patient underwent a DEXA scan on 08/09/2014 and was found to have osteopenia.   At the visit on 04/02/2014, Kerry [name] was having a fair amount of fatigue and he wasn't able to engage in the activities that he enjoys. We had a long discussion with him and his daughter and decided to stop the ADT therapy and see if his fatigue improved.  Over the last few months, he states his fatigue has improved.  He is able to play golf and spend time with his wife.  He is a little frustrated at this time because he recently sold his vehicle.  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   Today, he reports good energy levels.  He is not experiencing weight loss, appetite loss or bone pain.  He has not experienced fevers, chills, nausea or vomiting.  He has no urinary symptoms at this time.  He has been diagnosed with early stage Alzheimer's and demonstrates some mild memory loss during today's visit.  His PSA has risen from  1.7 ng/mL to 2.6 ng/mL in the last four months.   PMH: Past Medical History  Diagnosis Date  . Nocturia   .  Neuropathy ([place])   . Incontinence   . Osteoarthritis   . Prostatitis   . Skin cancer of face   . Sleep apnea   . TIA (transient ischemic attack)   . HLD (hyperlipidemia)   . Gout   . Eye lesion   . DDD (degenerative disc disease), cervical   . BPH (benign prostatic hyperplasia)   . A-fib ([place])   . Prostate cancer ([place])   . Elevated PSA   . Alzheimer disease     Surgical History: Past Surgical History  Procedure Laterality Date  . Transurethral resection of prostate  1993    Dr. Eliberto [name]  . Hernia repair    . Mohs surgery  2015    skin cancer surgery on the face and neck    Home Medications:    Medication List       This list is accurate as of: 04/15/15  3:00 PM.  Always use your most recent med list.               calcium carbonate 600 MG Tabs tablet  Commonly known as:  OS-CAL  Take by mouth.     cyanocobalamin 1000 MCG tablet  Take by mouth.     folic acid Q000111Q MCG tablet  Commonly known as:  FOLVITE  Take by mouth.     LORazepam 1 MG tablet  Commonly known  as:  ATIVAN  Reported on 04/15/2015     PRESCRIPTION MEDICATION  Reported on 04/15/2015     PRESERVISION AREDS PO  Take by mouth.     PRESERVISION/LUTEIN PO  Take by mouth. Reported on 04/15/2015     warfarin 2 MG tablet  Commonly known as:  COUMADIN  TAKE 1 TABLET ONE TIME DAILY        Allergies:  Allergies  Allergen Reactions  . Aricept [Donepezil]     Anxiety   . Cephalexin Itching    Family History: Family History  Problem Relation Age of Onset  . Pancreatic cancer Mother   . Throat cancer Father   . CAD Father   . Heart attack Father   . Diabetes Sister   . Heart disease Father   . Kidney disease Neg Hx   . Prostate cancer      half uncle    Social History:  reports that he has never smoked. He does not have any smokeless tobacco history on file. He reports that he does not drink alcohol or use illicit drugs.  ROS: UROLOGY Frequent Urination?: No Hard to postpone  urination?: No Burning/pain with urination?: No Get up at night to urinate?: No Leakage of urine?: No Urine stream starts and stops?: No Trouble starting stream?: No Do you have to strain to urinate?: No Blood in urine?: No Urinary tract infection?: No Sexually transmitted disease?: No Injury to kidneys or bladder?: No Painful intercourse?: No Weak stream?: No Erection problems?: No Penile pain?: No  Gastrointestinal Nausea?: No Vomiting?: No Indigestion/heartburn?: No Diarrhea?: No Constipation?: No  Constitutional Fever: No Night sweats?: No Weight loss?: No Fatigue?: No  Skin Skin rash/lesions?: No Itching?: Yes  Eyes Blurred vision?: No Double vision?: No  Ears/Nose/Throat Sore throat?: No Sinus problems?: No  Hematologic/Lymphatic Swollen glands?: No Easy bruising?: No  Cardiovascular Leg swelling?: No Chest pain?: No  Respiratory Cough?: No Shortness of breath?: No  Endocrine Excessive thirst?: No  Musculoskeletal Back pain?: No Joint pain?: No  Neurological Headaches?: No Dizziness?: No  Psychologic Depression?: No Anxiety?: No  Physical Exam: BP 109/64 mmHg  Pulse 66  Ht 5\' 8"  (1.727 m)  Wt 155 lb 8 oz (70.534 kg)  BMI 23.65 kg/m2  Constitutional: Well nourished. Alert and oriented, No acute distress. HEENT: [place] AT, moist mucus membranes. Trachea midline, no masses. Cardiovascular: No clubbing, cyanosis, or edema. Respiratory: Normal respiratory effort, no increased work of breathing. Skin: No rashes, bruises or suspicious lesions. Lymph: No cervical or inguinal adenopathy. Neurologic: Grossly intact, no focal deficits, moving all 4 extremities. Psychiatric: Normal mood and affect.  Laboratory Data: Lab Results  Component Value Date   WBC 7.4 04/02/2014   HGB 13.8 04/02/2014   HCT 42 04/02/2014   PLT 177 04/02/2014    Lab Results  Component Value Date   CREATININE 1.3 04/02/2014   Lab Results  Component Value  Date   HGBA1C 6.4* 09/14/2013   Pertinent Imaging: EXAM: DUAL X-RAY ABSORPTIOMETRY (DXA) FOR BONE MINERAL DENSITY  IMPRESSION: Dear Dr. Rosanna [name],  Your patient Kerry [name] completed a BMD test on 08/09/2014 using the [place] (analysis version: 14.10) manufactured by [employer]. The following summarizes the results of our evaluation.  PATIENT BIOGRAPHICAL: Name: Kerry [name], Kerry [name] Patient ID: [id number] Birth Date: 1918/08/27 Height: 63.0 in. Gender: Male Exam Date: 08/09/2014 Weight: 152.0 lbs. Indications: Advanced Age, Caucasian, Height Loss, On High Risk Meds, Prostate ca  Treatments: CALCIUM VIT D, lupron injections, simvastatin,  warfin  ASSESSMENT:  The BMD measured at Forearm Radius 33% is 0.802 g/cm2 with a T-score of -1.9. This patient is considered osteopenic by World Health Organization [facility]) Criteria. Lumbar spine was not utilized due to advanced degenerative changes. Patient is not eligible for FRAX due to age over 7.  Site Region Measured Measured WHO Young Adult BMD Date Age Classification T-score DualFemur Neck Left 08/09/2014 93.8 Osteopenia -1.6 0.856 g/cm2  Left Forearm Radius 33% 08/09/2014 93.8 Osteopenia -1.9 0.802 g/cm2  World Health Organization [facility]) criteria for post-menopausal, Caucasian Women: Normal: T-score at or above -1 SD Osteopenia: T-score between -1 and -2.5 SD Osteoporosis: T-score at or below -2.5 SD  RECOMMENDATIONS: [place] recommends that FDA-approved medical therapies be considered in postmenopausal women and men age 8 or older with a: 1. Hip or vertebral (clinical or morphometric) fracture. 2. T-score of < -2.5 at the spine or hip. 3. Ten-year fracture probability by FRAX of 3% or greater for hip fracture or 20% or greater for major osteoporotic fracture.  All treatment decisions require clinical judgment and consideration of individual patient  factors, including patient preferences, co-morbidities, previous drug use, risk factors not captured in the FRAX model (e.g. falls, vitamin D deficiency, increased bone turnover, interval significant decline in bone density) and possible under - or over-estimation of fracture risk by FRAX.  All patients should ensure an adequate intake of dietary calcium (1200 mg/d) and vitamin D (800 IU daily) unless contraindicated.  FOLLOW-UP: People with diagnosed cases of osteoporosis or osteopenia should be regularly tested for bone mineral density. For patients eligible for Medicare, routine testing is allowed once every 2 years. The testing frequency can be increased to one year for patients who have rapidly progressing disease, or for those who are receiving medical therapy to restore bone mass.  I have reviewed this report, and agree with the above findings.  [facility]iology   Electronically Signed  By: Earle Gell M.D.  On: 08/09/2014 14:50  Assessment & Plan:    1. Prostate cancer:   Patient with a clinical diagnosis of prostate cancer.  No biopsy was performed.  Area of suspicion for metastasis in the T4 vertebrae on bone scan completed on 05/31/2013.  He is currently not receiving ADT therapy as it diminished his quality of life.  Patient's PSA has increased from 1.7 to 2.6 in the last four months.  Patient's daughter does not want to pursue further ADT at this time.  I expressed that I would like to obtain a bone scan at this time to investigate the progression of his disease.    I explained that I did not want the patient to suffer a pathological fracture.  They are agreeable to the study.  BUN + creatinine drawn today.      Return for bone scan report.  Zara Council, [facility] [address], [place], [place] [address] [phone]

## 2015-04-16 LAB — BUN+CREAT
BUN / CREAT RATIO: 21 (ref 10–22)
BUN: 30 mg/dL (ref 10–36)
Creatinine, Ser: 1.42 mg/dL — ABNORMAL HIGH (ref 0.76–1.27)
GFR calc non Af Amer: 42 mL/min/{1.73_m2} — ABNORMAL LOW (ref >59)
GFR, EST AFRICAN AMERICAN: 49 mL/min/{1.73_m2} — AB (ref >59)

## 2015-05-07 ENCOUNTER — Telehealth: Payer: Self-pay | Admitting: Family Medicine

## 2015-05-07 NOTE — Telephone Encounter (Signed)
Order faxed-aa

## 2015-05-07 NOTE — Telephone Encounter (Signed)
Pt's daughter would like a nurse to return her call b/c she needs the dates of pt's last pneumonia, TDAP, & Shingles vaccines. Please advise. Thanks TNP

## 2015-05-07 NOTE — Telephone Encounter (Signed)
Ok to write order? Please advise  

## 2015-05-07 NOTE — Telephone Encounter (Signed)
Kerry [name] with [facility] would like to get written orders for pt to get PT/PTT, faxed to [phone]. Pt's daughter is in town and plans to schedule an appt with [name] but before she does she requested this test be done and Kerry [name] stated pt hasn't had a PT done since November 2016. Please advise. Thanks TNP

## 2015-05-07 NOTE — Telephone Encounter (Signed)
Order PT but pt needs appt in feb with a daughter.Needs MMSE done then

## 2015-05-08 NOTE — Telephone Encounter (Signed)
lmtcb-aa 

## 2015-05-10 ENCOUNTER — Telehealth: Payer: Self-pay

## 2015-05-10 ENCOUNTER — Encounter
Admission: RE | Admit: 2015-05-10 | Discharge: 2015-05-10 | Disposition: A | Discharge status: Home / Self Care | Payer: Medicare PPO | Source: Ambulatory Visit | Attending: Urology | Admitting: Urology

## 2015-05-10 ENCOUNTER — Encounter: Admission: RE | Admit: 2015-05-10 | Payer: Medicare PPO | Source: Ambulatory Visit

## 2015-05-10 DIAGNOSIS — C61 Malignant neoplasm of prostate: Secondary | ICD-10-CM | POA: Diagnosis present

## 2015-05-10 MED ORDER — TECHNETIUM TC 99M MEDRONATE IV KIT
21.4460 | PACK | Freq: Once | INTRAVENOUS | Status: AC | PRN
  Administered 2015-05-10: 21.446 via INTRAVENOUS

## 2015-05-10 NOTE — Telephone Encounter (Signed)
PT/ INR Results:  INR: 1.23     PT: 15.7  done 05/09/2015  Per Dr. Caryn Section, need to verify current [name] dosage. Called and spoke with patient daughter Kerry [name]. She also  States that patient has Alzheimer's and wont remember any changes if made. Patient is adamant  about taking coumadin every day becasue he has been taking it for a while. Patient lives [place] at [place] and manages him own medications. Patient has an appointment to see [name] Tuesday at 3:15pm for a follow up. Kerry [name] states that [name] was trying to get patient off of Coumadin because of the increased risk for falls and possibility of overdosing due to Alzheimers. Kerry [name] states that patient will not be able to remember any dosage adjustments. I advised Dr. Caryn Section of this and he recommend patient continue same dose and follow up with [name] on Tuesday. Kerry [name] advised and verbally voiced understanding.

## 2015-05-13 ENCOUNTER — Ambulatory Visit (INDEPENDENT_AMBULATORY_CARE_PROVIDER_SITE_OTHER): Payer: Medicare PPO | Admitting: Urology

## 2015-05-13 ENCOUNTER — Encounter: Payer: Self-pay | Admitting: Urology

## 2015-05-13 VITALS — BP 129/73 | HR 75 | Ht 68.0 in | Wt 157.0 lb

## 2015-05-13 DIAGNOSIS — C61 Malignant neoplasm of prostate: Secondary | ICD-10-CM | POA: Diagnosis not present

## 2015-05-13 DIAGNOSIS — C7951 Secondary malignant neoplasm of bone: Secondary | ICD-10-CM

## 2015-05-13 NOTE — Telephone Encounter (Signed)
Patient has appointment tomorrow 05/14/15 will discuss it then unless daughter does not come with him and if we dont hear back from her. thanks

## 2015-05-13 NOTE — Progress Notes (Signed)
2:01 PM   Shrish State March 13, 1919 [id number]  Referring provider: Jerrol Banana., MD [address] [place], [place] [address]  Chief Complaint  Patient presents with  . Prostate Cancer    Bone scan   . Results    HPI: Patient is a 82 year old Caucasian male with stage IV prostate cancer who is on intermittent ADT therapy who presents today to discuss his bone scan results.  Previous history Patient is a 82 year old white male with prostate cancer who is on intermittent ADT therapy. He initially presented with a PSA level of 61.7 ng/mL. His bone scan completed on 05/31/2013 noted a lesion in T4 which was suspicious for metastases. He underwent four injections of Firmagon. His last PSA was 0.4 ng/mL on 07/30/2014 and his last serum testosterone was 25 ng/dL on 07/30/2014. He is taking his calcium supplements and Vitamin D.  Patient underwent a DEXA scan on 08/09/2014 and was found to have osteopenia.   At the visit on 04/02/2014, Mr. Hearns was having a fair amount of fatigue and he wasn't able to engage in the activities that he enjoys. We had a long discussion with him and his daughter and decided to stop the ADT therapy and see if his fatigue improved.  Over the last few months, he states his fatigue has improved.  He is able to play golf and spend time with his wife.  He is a little frustrated at this time because he recently sold his vehicle.  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   Today, he reports good energy levels.  He is not experiencing weight loss, appetite loss or bone pain.  He has not experienced fevers, chills, nausea or vomiting.  He has no urinary symptoms at this time.  He has been diagnosed with early stage Alzheimer's and demonstrates some mild memory loss during today's visit.  His PSA has risen from  1.7 ng/mL to 2.6 ng/mL in the last four months.   His bone scan completed on 05/10/2015  noted a stable focal area of increased activity upper thoracic spine and a subtle new focus of increased activity L2. This could be from degenerative change. A focal metastatic lesion cannot be excluded. No other abnormalities identified.  Patient's daughter states that he has had some spinal procedure in the past.    PMH: Past Medical History  Diagnosis Date  . Nocturia   . Neuropathy ([place])   . Incontinence   . Osteoarthritis   . Prostatitis   . Skin cancer of face   . Sleep apnea   . TIA (transient ischemic attack)   . HLD (hyperlipidemia)   . Gout   . Eye lesion   . DDD (degenerative disc disease), cervical   . BPH (benign prostatic hyperplasia)   . A-fib ([place])   . Prostate cancer ([place])   . Elevated PSA   . Alzheimer disease     Surgical History: Past Surgical History  Procedure Laterality Date  . Transurethral resection of prostate  1993    Dr. Eliberto Ivory  . Hernia repair    . Mohs surgery  2015    skin cancer surgery on the face and neck    Home Medications:    Medication List       This list is accurate as of: 05/13/15  2:01 PM.  Always use your most recent med list.  calcium carbonate 600 MG Tabs tablet  Commonly known as:  OS-CAL  Take by mouth.     cyanocobalamin 1000 MCG tablet  Take by mouth.     folic acid Q000111Q MCG tablet  Commonly known as:  FOLVITE  Take by mouth.     PRESERVISION AREDS PO  Take by mouth.     PRESERVISION/LUTEIN PO  Take by mouth. Reported on 04/15/2015     warfarin 2 MG tablet  Commonly known as:  COUMADIN  TAKE 1 TABLET ONE TIME DAILY        Allergies:  Allergies  Allergen Reactions  . Aricept [Donepezil]     Anxiety   . Cephalexin Itching    Family History: Family History  Problem Relation Age of Onset  . Pancreatic cancer Mother   . Throat cancer Father   . CAD Father   . Heart attack Father   . Diabetes Sister   . Heart disease Father   . Kidney disease Neg Hx   . Prostate cancer      half  uncle    Social History:  reports that he has never smoked. He does not have any smokeless tobacco history on file. He reports that he does not drink alcohol or use illicit drugs.  ROS: UROLOGY Frequent Urination?: No Hard to postpone urination?: No Burning/pain with urination?: No Get up at night to urinate?: Yes Leakage of urine?: No Urine stream starts and stops?: No Trouble starting stream?: No Do you have to strain to urinate?: No Blood in urine?: No Urinary tract infection?: No Sexually transmitted disease?: No Injury to kidneys or bladder?: No Painful intercourse?: No Weak stream?: No Erection problems?: No Penile pain?: No  Gastrointestinal Nausea?: No Vomiting?: No Indigestion/heartburn?: No Diarrhea?: No Constipation?: No  Constitutional Fever: No Night sweats?: No Weight loss?: No Fatigue?: No  Skin Skin rash/lesions?: No Itching?: No  Eyes Blurred vision?: Yes Double vision?: No  Ears/Nose/Throat Sore throat?: No Sinus problems?: No  Hematologic/Lymphatic Swollen glands?: No Easy bruising?: No  Cardiovascular Leg swelling?: No Chest pain?: No  Respiratory Cough?: No Shortness of breath?: No  Endocrine Excessive thirst?: No  Musculoskeletal Back pain?: No Joint pain?: No  Neurological Headaches?: No Dizziness?: No  Psychologic Depression?: No Anxiety?: No  Physical Exam: BP 129/73 mmHg  Pulse 75  Ht 5\' 8"  (1.727 m)  Wt 157 lb (71.215 kg)  BMI 23.88 kg/m2  Constitutional: Well nourished. Alert and oriented, No acute distress. HEENT: [place] AT, moist mucus membranes. Trachea midline, no masses. Cardiovascular: No clubbing, cyanosis, or edema. Respiratory: Normal respiratory effort, no increased work of breathing. Skin: No rashes, bruises or suspicious lesions. Lymph: No cervical or inguinal adenopathy. Neurologic: Grossly intact, no focal deficits, moving all 4 extremities. Psychiatric: Normal mood and  affect.  Laboratory Data: Lab Results  Component Value Date   WBC 7.4 04/02/2014   HGB 13.8 04/02/2014   HCT 42 04/02/2014   PLT 177 04/02/2014    Lab Results  Component Value Date   CREATININE 1.42* 04/15/2015   Lab Results  Component Value Date   HGBA1C 6.4* 09/14/2013   Pertinent Imaging: CLINICAL DATA: Prostate cancer.  EXAM: NUCLEAR MEDICINE WHOLE BODY BONE SCAN  TECHNIQUE: Whole body anterior and posterior images were obtained approximately 3 hours after intravenous injection of radiopharmaceutical.  RADIOPHARMACEUTICALS: 21.5 mCi Technetium-70m MDP IV  COMPARISON: 05/31/2013.  FINDINGS: Bilateral renal function and excretion. Stable activity in the upper thoracic. Focal area of increased activity L2. This could be  from degenerative change or metastatic lesion. No other significant abnormality identified.  IMPRESSION: 1. Stable focal area of increased activity upper thoracic spine. 2. Subtle new focus of increased activity L2. This could be from degenerative change. A focal metastatic lesion cannot be excluded. No other abnormalities identified.   Electronically Signed  By: Marcello Moores Register  On: 05/10/2015 15:46       Assessment & Plan:    1. Prostate cancer:   Patient with a clinical diagnosis of prostate cancer.  No biopsy was performed.  Area of suspicion for metastasis in the T4 vertebrae and on L2 on the bone scan completed on 05/10/2015.  He is currently not receiving ADT therapy as it diminished his quality of life.  Patient's PSA has increased from 1.7 to 2.6 in the last four months.  Family and patient are still concerned with his quality of life versus quantity and do not want to restart the ADT at this time.  He will be returning in April 2017 for a PSA and testosterone level.     Return in about 2 months (around 07/11/2015) for PSA and testorone level and office visit .  Zara Council, [facility] [address], [place], [place] [address] [phone]

## 2015-05-14 ENCOUNTER — Encounter: Payer: Self-pay | Admitting: Family Medicine

## 2015-05-14 ENCOUNTER — Telehealth: Payer: Self-pay | Admitting: Urology

## 2015-05-14 ENCOUNTER — Ambulatory Visit (INDEPENDENT_AMBULATORY_CARE_PROVIDER_SITE_OTHER): Payer: Medicare PPO | Admitting: Family Medicine

## 2015-05-14 VITALS — BP 124/58 | HR 72 | Temp 97.3°F | Resp 18 | Wt 158.0 lb

## 2015-05-14 DIAGNOSIS — F0280 Dementia in other diseases classified elsewhere without behavioral disturbance: Secondary | ICD-10-CM

## 2015-05-14 DIAGNOSIS — G300 Alzheimer's disease with early onset: Secondary | ICD-10-CM | POA: Diagnosis not present

## 2015-05-14 DIAGNOSIS — I4891 Unspecified atrial fibrillation: Secondary | ICD-10-CM | POA: Diagnosis not present

## 2015-05-14 MED ORDER — RIVAROXABAN 15 MG PO TABS: 15.0000 mg | ORAL_TABLET | Freq: Every day | ORAL | Status: DC

## 2015-05-14 NOTE — Telephone Encounter (Signed)
I would like to know if the patient and his family would be interested in starting an older prostate cancer medication, Casodex, since he is not having the Lupron injections at this time.  It is a pill that he would take everyday and it may cause fatigue and it may not.  The pill acts by "scooping" up the testosterone the body makes and doesn't let it bind to the cancer cells.  Or would they like to wait until their appointment in April to discuss this further?

## 2015-05-14 NOTE — Progress Notes (Signed)
Patient ID: Kerry [name], male   DOB: 21-Oct-1918, 82 y.o.   MRN: [id number]    Subjective:  HPI  Patient is here for 4 months follow up.  Alzheimers: patient saw Dr. Manuella Ghazi per daughter patient is still in early stage of Alzheimers. Patient has stopped driving. Daughter is proud of him.   Atrial fibrillation: Patient takes Coumadin 1 mg daily. Last INR 1.23 on 05/09/15 no changes were made. He is still handling Coumadin medication by himself.  Prior to Admission medications   Medication Sig Start Date End Date Taking? Authorizing Provider  calcium carbonate (OS-CAL) 600 MG TABS tablet Take by mouth.   Yes Historical Provider, MD  cyanocobalamin 1000 MCG tablet Take by mouth. 10/03/12  Yes Historical Provider, MD  folic acid (FOLVITE) Q000111Q MCG tablet Take by mouth.   Yes Historical Provider, MD  Multiple Vitamins-Minerals (PRESERVISION AREDS PO) Take by mouth.   Yes Historical Provider, MD  Multiple Vitamins-Minerals (PRESERVISION/LUTEIN PO) Take by mouth. Reported on 04/15/2015   Yes Historical Provider, MD  warfarin (COUMADIN) 2 MG tablet TAKE 1 TABLET ONE TIME DAILY 11/30/14  Yes Jerrol Banana., MD    Patient Active Problem List   Diagnosis Date Noted  . Prostate cancer ([place]) 04/15/2015  . Prostate cancer metastatic to bone ([place]) 12/10/2014  . Memory loss 12/10/2014  . Adenocarcinoma of prostate ([place]) 08/09/2014  . Allergic rhinitis 08/09/2014  . Arthritis 08/09/2014  . A-fib ([place]) 08/09/2014  . Basal cell carcinoma 08/09/2014  . Benign fibroma of prostate 08/09/2014  . Benign neoplasm of colon 08/09/2014  . Narrowing of intervertebral disc space 08/09/2014  . General unsteadiness 08/09/2014  . Hypercholesteremia 08/09/2014  . Hypoxia 08/09/2014  . Malignant melanoma ([place]) 08/09/2014  . Idiopathic peripheral neuropathy ([place]) 08/09/2014  . Arthritis, degenerative 08/09/2014  . Apnea, sleep 08/09/2014  . Temporary cerebral vascular dysfunction 08/09/2014  . B12  deficiency 08/09/2014  . Atrial fibrillation ([place]) 08/09/2014  . Chronic atrial fibrillation ([place]) 04/27/2014  . Disorder of peripheral autonomic nervous system 08/17/2013    Past Medical History  Diagnosis Date  . Nocturia   . Neuropathy ([place])   . Incontinence   . Osteoarthritis   . Prostatitis   . Skin cancer of face   . Sleep apnea   . TIA (transient ischemic attack)   . HLD (hyperlipidemia)   . Gout   . Eye lesion   . DDD (degenerative disc disease), cervical   . BPH (benign prostatic hyperplasia)   . A-fib ([place])   . Prostate cancer ([place])   . Elevated PSA   . Alzheimer disease     Social History   Social History  . Marital Status: Married    Spouse Name: N/A  . Number of Children: N/A  . Years of Education: N/A   Occupational History  . Not on file.   Social History Main Topics  . Smoking status: Never Smoker   . Smokeless tobacco: Never Used  . Alcohol Use: No  . Drug Use: No  . Sexual Activity: Not on file   Other Topics Concern  . Not on file   Social History Narrative    Allergies  Allergen Reactions  . Aricept [Donepezil]     Anxiety   . Cephalexin Itching    Review of Systems  Constitutional: Negative.   Respiratory: Negative.   Cardiovascular: Negative.     Immunization History  Administered Date(s) Administered  . Influenza, High Dose Seasonal PF 12/10/2014  . Pneumococcal  Conjugate-13 06/11/2014  . Pneumococcal Polysaccharide-23 11/07/1992  . Td 10/05/1991, 07/04/2014   Objective:  BP 124/58 mmHg  Pulse 72  Temp(Src) 97.3 F (36.3 C)  Resp 18  Wt 158 lb (71.668 kg)  Physical Exam  Constitutional: He is oriented to person, place, and time and well-developed, well-nourished, and in no distress.  HENT:  Head: Normocephalic and atraumatic.  Right Ear: External ear normal.  Left Ear: External ear normal.  Nose: Nose normal.  Cardiovascular: Normal rate, regular rhythm, normal heart sounds and intact distal pulses.   No  murmur heard. Pulmonary/Chest: Effort normal and breath sounds normal. No respiratory distress. He has no wheezes.  Musculoskeletal: He exhibits no edema or tenderness.  Neurological: He is alert and oriented to person, place, and time.  Psychiatric: Mood, memory, affect and judgment normal.    Lab Results  Component Value Date   WBC 7.4 04/02/2014   HGB 13.8 04/02/2014   HCT 42 04/02/2014   PLT 177 04/02/2014   CHOL 156 09/14/2013   TRIG 77 09/14/2013   HDL 67 09/14/2013   LDLCALC 74 09/14/2013   TSH 2.68 04/02/2014   INR 2.8* 12/14/2014   HGBA1C 6.4* 09/14/2013    CMP     Component Value Date/Time   NA 139 04/02/2014   K 5.2 04/02/2014   BUN 30 04/15/2015 1428   CREATININE 1.42* 04/15/2015 1428   CREATININE 1.3 04/02/2014   AST 21 04/02/2014   ALT 16 04/02/2014   ALKPHOS 84 04/02/2014   GFRNONAA 42* 04/15/2015 1428   GFRAA 49* 04/15/2015 1428    Assessment and Plan :  1. Atrial fibrillation, unspecified Due to memory issues and patient is the one handling his Warfarin medication. Safer option for patient is to switch to Xarelto and take the same dose daily. Samples given and if patient tolerates it will continue with this.  Rivaroxaban (XARELTO) 15 MG TABS tablet; Take 1 tablet (15 mg total) by mouth daily with supper.  Dispense: 14 tablet; Refill: 0 He has very infrequent falls so I think continuing and anticoagulation agent to try to lower his risk of stroke is completely appropriate. Benefits outweigh risks. Cost of this agent will be a factor in this is discussed with patient and his daughter. 2. Early onset Alzheimer's dementia without behavioral disturbance Following Dr. Manuella Ghazi.  I have done the exam and reviewed the above chart and it is accurate to the best of my knowledge.   Miguel Aschoff MD [facility] 05/14/2015 3:03 PM

## 2015-05-15 ENCOUNTER — Telehealth: Payer: Self-pay | Admitting: Urology

## 2015-05-15 NOTE — Telephone Encounter (Signed)
Spoke with Caren Griffins in reference to pt getting labs done at [facility]. Caren Griffins will call closer to appt to have orders faxed to [facility].

## 2015-05-15 NOTE — Telephone Encounter (Signed)
Spoke with pt daughter, Pamala Hurry, in reference to Casodex. Pamala Hurry stated at this time they would prefer not to start the medication until they have a more in depth discussion with Larene Beach at appt in April.

## 2015-05-15 NOTE — Telephone Encounter (Signed)
Pt's daughter, Caren Griffins, called.  Caren Griffins spoke to [place] about her father, Mr. Dorris Fetch, yesterday.  Mr. Leedom lives at [facility] and they were trying to arrange for him to have his labs drawn there instead of coming to our office due to transportation issues.  Mr. Scattergood other daughter, Pamala Hurry, would call our office two weeks before Mr. Schraeder's scheduled appointments with Larene Beach and the purpose of this phone call would be to ask our practice to fax a blood draw order to the nurse at [facility].  Their fax # at [facility] is [phone].  When [facility] receives the order, they will then notify the family of what day the lab would be able to be drawn at [facility].  The lab will fax the results to our office before the patient's appt.  If Larene Beach is okay with this, please give Caren Griffins a call to discuss this.  The family states that Mr. Ceballo has successfully done this process with his primary care physician and it seems to work fine.

## 2015-05-27 ENCOUNTER — Other Ambulatory Visit: Payer: Self-pay | Admitting: Family Medicine

## 2015-05-27 DIAGNOSIS — I4891 Unspecified atrial fibrillation: Secondary | ICD-10-CM

## 2015-05-27 MED ORDER — RIVAROXABAN 15 MG PO TABS: 15.0000 mg | ORAL_TABLET | Freq: Every day | ORAL | Status: DC

## 2015-05-27 NOTE — Telephone Encounter (Signed)
Pt's daughter contacted office for refill request on the following medications: Rivaroxaban (XARELTO) 15 MG TABS tablet to [facility] for 90 day supply. Thanks TNP

## 2015-06-06 ENCOUNTER — Encounter: Payer: Self-pay | Admitting: Family Medicine

## 2015-07-08 ENCOUNTER — Other Ambulatory Visit: Payer: Medicare PPO

## 2015-07-15 ENCOUNTER — Ambulatory Visit: Payer: Medicare PPO | Admitting: Urology

## 2015-07-25 ENCOUNTER — Emergency Department
Admission: EM | Admit: 2015-07-25 | Discharge: 2015-07-25 | Disposition: A | Discharge status: Home / Self Care | Payer: Medicare PPO | Attending: Emergency Medicine | Admitting: Emergency Medicine

## 2015-07-25 ENCOUNTER — Emergency Department: Payer: Medicare PPO

## 2015-07-25 ENCOUNTER — Encounter: Payer: Self-pay | Admitting: Emergency Medicine

## 2015-07-25 DIAGNOSIS — G309 Alzheimer's disease, unspecified: Secondary | ICD-10-CM | POA: Insufficient documentation

## 2015-07-25 DIAGNOSIS — E785 Hyperlipidemia, unspecified: Secondary | ICD-10-CM | POA: Diagnosis not present

## 2015-07-25 DIAGNOSIS — G909 Disorder of the autonomic nervous system, unspecified: Secondary | ICD-10-CM | POA: Insufficient documentation

## 2015-07-25 DIAGNOSIS — Z85828 Personal history of other malignant neoplasm of skin: Secondary | ICD-10-CM | POA: Insufficient documentation

## 2015-07-25 DIAGNOSIS — Z8546 Personal history of malignant neoplasm of prostate: Secondary | ICD-10-CM | POA: Insufficient documentation

## 2015-07-25 DIAGNOSIS — E7800 Pure hypercholesterolemia, unspecified: Secondary | ICD-10-CM | POA: Insufficient documentation

## 2015-07-25 DIAGNOSIS — Z85038 Personal history of other malignant neoplasm of large intestine: Secondary | ICD-10-CM | POA: Diagnosis not present

## 2015-07-25 DIAGNOSIS — R079 Chest pain, unspecified: Secondary | ICD-10-CM | POA: Insufficient documentation

## 2015-07-25 DIAGNOSIS — M1990 Unspecified osteoarthritis, unspecified site: Secondary | ICD-10-CM | POA: Insufficient documentation

## 2015-07-25 DIAGNOSIS — Z85830 Personal history of malignant neoplasm of bone: Secondary | ICD-10-CM | POA: Diagnosis not present

## 2015-07-25 DIAGNOSIS — Z79899 Other long term (current) drug therapy: Secondary | ICD-10-CM | POA: Diagnosis not present

## 2015-07-25 DIAGNOSIS — Z8673 Personal history of transient ischemic attack (TIA), and cerebral infarction without residual deficits: Secondary | ICD-10-CM | POA: Diagnosis not present

## 2015-07-25 LAB — BASIC METABOLIC PANEL
ANION GAP: 10 (ref 5–15)
BUN: 31 mg/dL — AB (ref 6–20)
CALCIUM: 10.4 mg/dL — AB (ref 8.9–10.3)
CO2: 27 mmol/L (ref 22–32)
CREATININE: 1.58 mg/dL — AB (ref 0.61–1.24)
Chloride: 102 mmol/L (ref 101–111)
GFR calc Af Amer: 41 mL/min — ABNORMAL LOW (ref >60)
GFR, EST NON AFRICAN AMERICAN: 36 mL/min — AB (ref >60)
GLUCOSE: 98 mg/dL (ref 65–99)
POTASSIUM: 4.4 mmol/L (ref 3.5–5.1)
Sodium: 139 mmol/L (ref 135–145)

## 2015-07-25 LAB — CBC
HCT: 43.1 % (ref 40.0–52.0)
Hemoglobin: 14.2 g/dL (ref 13.0–18.0)
MCH: 30.2 pg (ref 26.0–34.0)
MCHC: 33.1 g/dL (ref 32.0–36.0)
MCV: 91.3 fL (ref 80.0–100.0)
PLATELETS: 157 10*3/uL (ref 150–440)
RBC: 4.72 MIL/uL (ref 4.40–5.90)
RDW: 14.9 % — AB (ref 11.5–14.5)
WBC: 6.5 10*3/uL (ref 3.8–10.6)

## 2015-07-25 LAB — TROPONIN I: Troponin I: <0.03 ng/mL (ref <0.031)

## 2015-07-25 NOTE — ED Notes (Signed)
Pt presents to ED from [facility]. Pt brought in by daughter who states the NP at the facility reports pt complained of left side chest pain that radiated to left arm yesterday and today. Pt denies chest pain at present. Pt in no apparent distress in triage.

## 2015-07-25 NOTE — Discharge Instructions (Signed)

## 2015-07-25 NOTE — ED Provider Notes (Signed)
Time Seen: Approximately 1600  I have reviewed the triage notes  Chief Complaint: Chest Pain   History of Present Illness: Kerry [name] is a 82 y.o. male who was transported here by EMS [facility] for evaluation of some left upper chest discomfort. He apparently complained of the pain for a short period of time yesterday and then he had notified the staff today that  he was having some chest discomfort around noon. Patient denies any associated symptoms with it and resolved when the patient arrived here to the emergency department. He states he feels fine at present. He denies any nausea, vomiting, shortness of breath. He states it may be some muscle soreness but the patient has a history of dementia and is somewhat unreliable historian. There is no history of fever, chills, productive cough. No history of radiation to the arm or jaw. His daughter is here currently and is able to fill in some of the blanks on his history and review of systems Past Medical History  Diagnosis Date  . Nocturia   . Neuropathy ([place])   . Incontinence   . Osteoarthritis   . Prostatitis   . Skin cancer of face   . Sleep apnea   . TIA (transient ischemic attack)   . HLD (hyperlipidemia)   . Gout   . Eye lesion   . DDD (degenerative disc disease), cervical   . BPH (benign prostatic hyperplasia)   . A-fib ([place])   . Prostate cancer ([place])   . Elevated PSA   . Alzheimer disease     Patient Active Problem List   Diagnosis Date Noted  . Prostate cancer ([place]) 04/15/2015  . Prostate cancer metastatic to bone ([place]) 12/10/2014  . Memory loss 12/10/2014  . Adenocarcinoma of prostate ([place]) 08/09/2014  . Allergic rhinitis 08/09/2014  . Arthritis 08/09/2014  . A-fib ([place]) 08/09/2014  . Basal cell carcinoma 08/09/2014  . Benign fibroma of prostate 08/09/2014  . Benign neoplasm of colon 08/09/2014  . Narrowing of intervertebral disc space 08/09/2014  . General unsteadiness 08/09/2014   . Hypercholesteremia 08/09/2014  . Hypoxia 08/09/2014  . Malignant melanoma ([place]) 08/09/2014  . Idiopathic peripheral neuropathy ([place]) 08/09/2014  . Arthritis, degenerative 08/09/2014  . Apnea, sleep 08/09/2014  . Temporary cerebral vascular dysfunction 08/09/2014  . B12 deficiency 08/09/2014  . Atrial fibrillation ([place]) 08/09/2014  . Chronic atrial fibrillation ([place]) 04/27/2014  . Disorder of peripheral autonomic nervous system 08/17/2013    Past Surgical History  Procedure Laterality Date  . Transurethral resection of prostate  1993    Dr. Eliberto Ivory  . Hernia repair    . Mohs surgery  2015    skin cancer surgery on the face and neck    Past Surgical History  Procedure Laterality Date  . Transurethral resection of prostate  1993    Dr. Eliberto Ivory  . Hernia repair    . Mohs surgery  2015    skin cancer surgery on the face and neck    Current Outpatient Rx  Name  Route  Sig  Dispense  Refill  . calcium carbonate (OS-CAL) 600 MG TABS tablet   Oral   Take by mouth.         . cyanocobalamin 1000 MCG tablet   Oral   Take by mouth.         . folic acid (FOLVITE) Q000111Q MCG tablet   Oral   Take by mouth.         Marland Kitchen  Multiple Vitamins-Minerals (PRESERVISION AREDS PO)   Oral   Take by mouth.         . Multiple Vitamins-Minerals (PRESERVISION/LUTEIN PO)   Oral   Take by mouth. Reported on 04/15/2015         . Rivaroxaban (XARELTO) 15 MG TABS tablet   Oral   Take 1 tablet (15 mg total) by mouth daily with supper.   90 tablet   3     Allergies:  Aricept and Cephalexin  Family History: Family History  Problem Relation Age of Onset  . Pancreatic cancer Mother   . Throat cancer Father   . CAD Father   . Heart attack Father   . Diabetes Sister   . Heart disease Father   . Kidney disease Neg Hx   . Prostate cancer      half uncle    Social History: Social History  Substance Use Topics  . Smoking status: Never Smoker   . Smokeless tobacco: Never Used  .  Alcohol Use: No     Review of Systems:   10 point review of systems was performed and was otherwise negative:  Constitutional: No fever Eyes: No visual disturbances ENT: No sore throat, ear pain Cardiac: Chest discomfort described above Respiratory: No shortness of breath, wheezing, or stridor Abdomen: No abdominal pain, no vomiting, No diarrhea Endocrine: No weight loss, No night sweats Extremities: No peripheral edema, cyanosis Skin: No rashes, easy bruising Neurologic: No focal weakness, trouble with speech or swollowing Urologic: No dysuria, Hematuria, or urinary frequency   Physical Exam:  ED Triage Vitals  Enc Vitals Group     BP 07/25/15 1325 136/59 mmHg     Pulse Rate 07/25/15 1325 71     Resp 07/25/15 1325 22     Temp 07/25/15 1325 97.5 F (36.4 C)     Temp Source 07/25/15 1325 Oral     SpO2 07/25/15 1325 96 %     Weight 07/25/15 1325 135 lb (61.236 kg)     Height 07/25/15 1325 5\' 7"  (1.702 m)     Head Cir --      Peak Flow --      Pain Score --      Pain Loc --      Pain Edu? --      Excl. in [place]? --     General: Awake , Alert , and Oriented times 3; GCS 15 Head: Normal cephalic , atraumatic Eyes: Pupils equal , round, reactive to light Nose/Throat: No nasal drainage, patent upper airway without erythema or exudate.  Neck: Supple, Full range of motion, No anterior adenopathy or palpable thyroid masses Lungs: Clear to ascultation without wheezes , rhonchi, or rales Heart: Regular rate, irregular rhythm without murmurs , gallops , or rubs Abdomen: Soft, non tender without rebound, guarding , or rigidity; bowel sounds positive and symmetric in all 4 quadrants. No organomegaly .        Extremities: 2 plus symmetric pulses. No edema, clubbing or cyanosis Neurologic: normal ambulation, Motor symmetric without deficits, sensory intact Skin: warm, dry, no rashes No reproducible chest wall pain  Labs:   All laboratory work was reviewed including any pertinent  negatives or positives listed below:  Labs Reviewed  BASIC METABOLIC PANEL - Abnormal; Notable for the following:    BUN 31 (*)    Creatinine, Ser 1.58 (*)    Calcium 10.4 (*)    GFR calc non Af Amer 36 (*)    GFR calc  Af Amer 41 (*)    All other components within normal limits  CBC - Abnormal; Notable for the following:    RDW 14.9 (*)    All other components within normal limits  TROPONIN I  TROPONIN I    EKG: * ED ECG REPORT I, Daymon Larsen, the attending physician, personally viewed and interpreted this ECG.  Date: 07/25/2015 EKG Time: 1336 Rate: 69 Rhythm: Atrial fibrillation with a controlled rate QRS Axis: normal Intervals: normal ST/T Wave abnormalities: normal Conduction Disturbances: none Narrative Interpretation: unremarkable No acute ischemic changes   Radiology:   CHEST 2 VIEW  COMPARISON: May 02, 2010.  FINDINGS: The heart size and mediastinal contours are within normal limits. Both lungs are clear. No pneumothorax or pleural effusion is noted. The visualized skeletal structures are unremarkable.  IMPRESSION: No active cardiopulmonary disease.   Electronically Signed By: Marijo Conception, M.D.  I personally reviewed the radiologic studies    ED Course:  Differential includes all life-threatening causes for chest pain. This includes but is not exclusive to acute coronary syndrome, aortic dissection, pulmonary embolism, cardiac tamponade, community-acquired pneumonia, pericarditis, musculoskeletal chest wall pain, etc. Patient's stay here was uneventful and serial enzymes are negative and his EKG shows no ischemic changes. His presentation does not appear to be typical of life-threatening diseases such as pulmonary embolism, aortic dissection, acute coronary syndrome, etc. Patient wishes to be discharged as does his family they're advised to follow up with her primary physician. They understand that the source of his chest pain is unknown  at this time.    Assessment: * Acute unspecified chest pain    Plan: * Outpatient management Patient was advised to return immediately if condition worsens. Patient was advised to follow up with their primary care physician or other specialized physicians involved in their outpatient care. The patient and/or family member/power of attorney had laboratory results reviewed at the bedside. All questions and concerns were addressed and appropriate discharge instructions were distributed by the nursing staff.             Daymon Larsen, MD 07/25/15 [phone]

## 2015-07-25 NOTE — ED Notes (Signed)
Patient transported to X-ray 

## 2015-07-29 ENCOUNTER — Encounter: Payer: Self-pay | Admitting: Family Medicine

## 2015-07-29 ENCOUNTER — Ambulatory Visit (INDEPENDENT_AMBULATORY_CARE_PROVIDER_SITE_OTHER): Payer: Medicare PPO | Admitting: Family Medicine

## 2015-07-29 VITALS — BP 112/58 | HR 64 | Resp 14 | Wt 149.0 lb

## 2015-07-29 DIAGNOSIS — G4730 Sleep apnea, unspecified: Secondary | ICD-10-CM | POA: Diagnosis not present

## 2015-07-29 DIAGNOSIS — H109 Unspecified conjunctivitis: Secondary | ICD-10-CM | POA: Diagnosis not present

## 2015-07-29 DIAGNOSIS — R682 Dry mouth, unspecified: Secondary | ICD-10-CM

## 2015-07-29 MED ORDER — GENTAMICIN SULFATE 0.3 % OP SOLN
2.0000 [drp] | Freq: Three times a day (TID) | OPHTHALMIC | Status: DC
Start: 2015-07-29 — End: 2015-09-09

## 2015-07-29 NOTE — Progress Notes (Signed)
Patient ID: Kerry [name], male   DOB: 31-Jan-1919, 82 y.o.   MRN: [id number]    Subjective:  HPI Pt reports that when he gets up in the mornings he feels like his eye and mouth are "sticky" He only notices this when he has been asleep in the bed and during the night. No when he takes naps during the day. He does not have any problems with it once he gets up and drinks plenty of water. He reports that this has been going on for "months" He used to use breath right strips and he has stopped using them, unsure if this is what is causing this.   He was also seen in the ED on 07/25/15 for shoulder/chest pain. It was ruled to not be life threatening. This has since resolved. However he does have mid back pain that is new. It is worse when he walks.  Prior to Admission medications   Medication Sig Start Date End Date Taking? Authorizing Provider  calcium carbonate (OS-CAL) 600 MG TABS tablet Take by mouth.   Yes Historical Provider, MD  cyanocobalamin 1000 MCG tablet Take by mouth. 10/03/12  Yes Historical Provider, MD  folic acid (FOLVITE) Q000111Q MCG tablet Take by mouth.   Yes Historical Provider, MD  Multiple Vitamins-Minerals (PRESERVISION AREDS PO) Take by mouth.   Yes Historical Provider, MD  Multiple Vitamins-Minerals (PRESERVISION/LUTEIN PO) Take by mouth. Reported on 04/15/2015   Yes Historical Provider, MD  Rivaroxaban (XARELTO) 15 MG TABS tablet Take 1 tablet (15 mg total) by mouth daily with supper. 05/27/15  Yes Richard Maceo Pro., MD    Patient Active Problem List   Diagnosis Date Noted  . Prostate cancer ([place]) 04/15/2015  . Prostate cancer metastatic to bone ([place]) 12/10/2014  . Memory loss 12/10/2014  . Adenocarcinoma of prostate ([place]) 08/09/2014  . Allergic rhinitis 08/09/2014  . Arthritis 08/09/2014  . A-fib ([place]) 08/09/2014  . Basal cell carcinoma 08/09/2014  . Benign fibroma of prostate 08/09/2014  . Benign neoplasm of colon 08/09/2014  . Narrowing of intervertebral disc  space 08/09/2014  . General unsteadiness 08/09/2014  . Hypercholesteremia 08/09/2014  . Hypoxia 08/09/2014  . Malignant melanoma ([place]) 08/09/2014  . Idiopathic peripheral neuropathy ([place]) 08/09/2014  . Arthritis, degenerative 08/09/2014  . Apnea, sleep 08/09/2014  . Temporary cerebral vascular dysfunction 08/09/2014  . B12 deficiency 08/09/2014  . Atrial fibrillation ([place]) 08/09/2014  . Chronic atrial fibrillation ([place]) 04/27/2014  . Disorder of peripheral autonomic nervous system 08/17/2013    Past Medical History  Diagnosis Date  . Nocturia   . Neuropathy ([place])   . Incontinence   . Osteoarthritis   . Prostatitis   . Skin cancer of face   . Sleep apnea   . TIA (transient ischemic attack)   . HLD (hyperlipidemia)   . Gout   . Eye lesion   . DDD (degenerative disc disease), cervical   . BPH (benign prostatic hyperplasia)   . A-fib ([place])   . Prostate cancer ([place])   . Elevated PSA   . Alzheimer disease     Social History   Social History  . Marital Status: Married    Spouse Name: N/A  . Number of Children: N/A  . Years of Education: N/A   Occupational History  . Not on file.   Social History Main Topics  . Smoking status: Never Smoker   . Smokeless tobacco: Never Used  . Alcohol Use: No  . Drug Use: No  . Sexual  Activity: Not on file   Other Topics Concern  . Not on file   Social History Narrative    Allergies  Allergen Reactions  . Aricept [Donepezil]     Anxiety   . Cephalexin Itching    Review of Systems  Constitutional: Negative.   HENT: Negative.        Dry mouth  Eyes: Negative.   Respiratory: Negative.   Cardiovascular: Negative.   Gastrointestinal: Negative.   Genitourinary: Negative.   Musculoskeletal: Positive for back pain.  Skin: Negative.   Neurological: Negative.   Endo/Heme/Allergies: Negative.   Psychiatric/Behavioral: Negative.     Immunization History  Administered Date(s) Administered  . Influenza, High Dose  Seasonal PF 12/10/2014  . Pneumococcal Conjugate-13 06/11/2014  . Pneumococcal Polysaccharide-23 11/07/1992  . Td 10/05/1991, 07/04/2014   Objective:  BP 112/58 mmHg  Pulse 64  Resp 14  Wt 149 lb (67.586 kg)  SpO2 95%  Physical Exam  Constitutional: He is oriented to person, place, and time and well-developed, well-nourished, and in no distress.  HENT:  Head: Normocephalic and atraumatic.  Right Ear: External ear normal.  Left Ear: External ear normal.  Nose: Nose normal.  Eyes: Conjunctivae and EOM are normal. Pupils are equal, round, and reactive to light.  Cardiovascular: Normal rate, regular rhythm, normal heart sounds and intact distal pulses.   Pulmonary/Chest: Effort normal and breath sounds normal.  Abdominal: Soft.  Musculoskeletal: Normal range of motion.  Neurological: He is alert and oriented to person, place, and time. He has normal reflexes. Gait normal. GCS score is 15.  Skin: Skin is warm and dry.  Psychiatric: Mood, memory, affect and judgment normal.    Lab Results  Component Value Date   WBC 6.5 07/25/2015   HGB 14.2 07/25/2015   HCT 43.1 07/25/2015   PLT 157 07/25/2015   GLUCOSE 98 07/25/2015   CHOL 156 09/14/2013   TRIG 77 09/14/2013   HDL 67 09/14/2013   LDLCALC 74 09/14/2013   TSH 2.68 04/02/2014   INR 2.8* 12/14/2014   HGBA1C 6.4* 09/14/2013    CMP     Component Value Date/Time   NA 139 07/25/2015 1339   NA 139 04/02/2014   K 4.4 07/25/2015 1339   CL 102 07/25/2015 1339   CO2 27 07/25/2015 1339   GLUCOSE 98 07/25/2015 1339   BUN 31* 07/25/2015 1339   BUN 30 04/15/2015 1428   CREATININE 1.58* 07/25/2015 1339   CREATININE 1.3 04/02/2014   CALCIUM 10.4* 07/25/2015 1339   AST 21 04/02/2014   ALT 16 04/02/2014   ALKPHOS 84 04/02/2014   GFRNONAA 36* 07/25/2015 1339   GFRAA 41* 07/25/2015 1339    Assessment and Plan :  1. Bilateral conjunctivitis Try Garamycin, if this does not work, then try Optivar and if neither works then will  see opthalmology.  - gentamicin (GARAMYCIN) 0.3 % ophthalmic solution; Place 2 drops into both eyes 3 (three) times daily.  Dispense: 5 mL; Refill: 0  2. Dry mouth Possibly due to sleep apnea and sleeping with mouth open. Try breathe right strips to see if this is resolved.   3. Sleep apnea Due to dementia it will be difficult to evaluate and treat possible sleep apnea. 4. Early Alzheimer's dementia, progressive This visit was mainly  Regarding his progressive medical problems He is still in independent living at [facility]  but I do not think he will be able to do this much longer. I think she will need assisted living soon.  Patient was seen and examined by Dr. Miguel Aschoff, and noted scribed by Webb Laws, [place] MD [facility] 07/29/2015 4:37 PM

## 2015-07-29 NOTE — Patient Instructions (Signed)
Try eye drops sent to the pharmacy, if this does not work then we will try Optivar allergy eye drop.

## 2015-09-04 ENCOUNTER — Other Ambulatory Visit: Payer: Self-pay

## 2015-09-09 ENCOUNTER — Encounter: Payer: Self-pay | Admitting: Family Medicine

## 2015-09-09 ENCOUNTER — Ambulatory Visit (INDEPENDENT_AMBULATORY_CARE_PROVIDER_SITE_OTHER): Payer: Medicare PPO | Admitting: Family Medicine

## 2015-09-09 ENCOUNTER — Ambulatory Visit (INDEPENDENT_AMBULATORY_CARE_PROVIDER_SITE_OTHER): Payer: Medicare PPO | Admitting: Urology

## 2015-09-09 ENCOUNTER — Encounter: Payer: Self-pay | Admitting: Urology

## 2015-09-09 VITALS — BP 105/65 | HR 71 | Ht 68.0 in | Wt 145.9 lb

## 2015-09-09 VITALS — BP 110/50 | HR 62 | Temp 97.4°F | Resp 16 | Wt 147.0 lb

## 2015-09-09 DIAGNOSIS — I482 Chronic atrial fibrillation, unspecified: Secondary | ICD-10-CM

## 2015-09-09 DIAGNOSIS — H109 Unspecified conjunctivitis: Secondary | ICD-10-CM

## 2015-09-09 DIAGNOSIS — C61 Malignant neoplasm of prostate: Secondary | ICD-10-CM

## 2015-09-09 DIAGNOSIS — R413 Other amnesia: Secondary | ICD-10-CM

## 2015-09-09 MED ORDER — BICALUTAMIDE 50 MG PO TABS: 50.0000 mg | ORAL_TABLET | Freq: Every day | ORAL | Status: DC

## 2015-09-09 MED ORDER — CIPROFLOXACIN HCL 0.3 % OP SOLN: 1.0000 [drp] | OPHTHALMIC | Status: DC

## 2015-09-09 NOTE — Progress Notes (Signed)
Patient ID: Kerry [name], male   DOB: [birth year], 82 y.o.   MRN: [id number]    Subjective:  HPI Pt is here for a 3 month follow up for memory loss. Daughter is with him and reports that [facility] has told her that he seems to get more confused when he wakes up from a nap or in the mornings what day it is or what time of day it is. He also has forgotten how to tie his shoes on some days but then he remembers. Daughter request a memory test be done here to get him home health and they need a MMSE. MMSE 21/30 He is progressive with dementia.No longer playing golf. Fall risk worsening due to unsteady gait.  He is still having the "sticky" feeling in his eyes and throat. He is convinced that it is from the [place]. Daughter reports that they spoke with Dr. Ubaldo Glassing and he has not seen that as a side effect but does not mean it is not possible. Told that they needed to weight the risk of the Xarelto and how bothersome the eyes are.   Prior to Admission medications   Medication Sig Start Date End Date Taking? Authorizing Provider  calcium carbonate (OS-CAL) 600 MG TABS tablet Take by mouth.   Yes Historical Provider, MD  cyanocobalamin 1000 MCG tablet Take by mouth. 10/03/12  Yes Historical Provider, MD  folic acid (FOLVITE) Q000111Q MCG tablet Take by mouth.   Yes Historical Provider, MD  gentamicin (GARAMYCIN) 0.3 % ophthalmic solution Place 2 drops into both eyes 3 (three) times daily. 07/29/15  Yes Richard Maceo Pro., MD  Multiple Vitamins-Minerals (PRESERVISION AREDS PO) Take by mouth.   Yes Historical Provider, MD  Multiple Vitamins-Minerals (PRESERVISION/LUTEIN PO) Take by mouth. Reported on 04/15/2015   Yes Historical Provider, MD  Rivaroxaban (XARELTO) 15 MG TABS tablet Take 1 tablet (15 mg total) by mouth daily with supper. 05/27/15  Yes Richard Maceo Pro., MD    Patient Active Problem List   Diagnosis Date Noted  . Prostate cancer ([place]) 04/15/2015  . Prostate cancer metastatic to bone  ([place]) 12/10/2014  . Memory loss 12/10/2014  . Adenocarcinoma of prostate ([place]) 08/09/2014  . Allergic rhinitis 08/09/2014  . Arthritis 08/09/2014  . A-fib ([place]) 08/09/2014  . Basal cell carcinoma 08/09/2014  . Benign fibroma of prostate 08/09/2014  . Benign neoplasm of colon 08/09/2014  . Narrowing of intervertebral disc space 08/09/2014  . General unsteadiness 08/09/2014  . Hypercholesteremia 08/09/2014  . Hypoxia 08/09/2014  . Malignant melanoma ([place]) 08/09/2014  . Idiopathic peripheral neuropathy ([place]) 08/09/2014  . Arthritis, degenerative 08/09/2014  . Apnea, sleep 08/09/2014  . Temporary cerebral vascular dysfunction 08/09/2014  . B12 deficiency 08/09/2014  . Atrial fibrillation ([place]) 08/09/2014  . Chronic atrial fibrillation ([place]) 04/27/2014  . Disorder of peripheral autonomic nervous system 08/17/2013    Past Medical History  Diagnosis Date  . Nocturia   . Neuropathy ([place])   . Incontinence   . Osteoarthritis   . Prostatitis   . Skin cancer of face   . Sleep apnea   . TIA (transient ischemic attack)   . HLD (hyperlipidemia)   . Gout   . Eye lesion   . DDD (degenerative disc disease), cervical   . BPH (benign prostatic hyperplasia)   . A-fib ([place])   . Prostate cancer ([place])   . Elevated PSA   . Alzheimer disease     Social History   Social History  .  Marital Status: Married    Spouse Name: N/A  . Number of Children: N/A  . Years of Education: N/A   Occupational History  . Not on file.   Social History Main Topics  . Smoking status: Never Smoker   . Smokeless tobacco: Never Used  . Alcohol Use: No  . Drug Use: No  . Sexual Activity: Not on file   Other Topics Concern  . Not on file   Social History Narrative    Allergies  Allergen Reactions  . Aricept [Donepezil]     Anxiety   . Cephalexin Itching    Review of Systems  Constitutional: Negative.   HENT: Negative.   Eyes: Positive for discharge.  Respiratory: Negative.     Cardiovascular: Negative.   Gastrointestinal: Negative.   Genitourinary: Negative.   Musculoskeletal: Negative.   Skin: Negative.   Neurological: Negative.   Endo/Heme/Allergies: Negative.   Psychiatric/Behavioral: Positive for memory loss.    Immunization History  Administered Date(s) Administered  . Influenza, High Dose Seasonal PF 12/10/2014  . Pneumococcal Conjugate-13 06/11/2014  . Pneumococcal Polysaccharide-23 11/07/1992  . Td 10/05/1991, 07/04/2014   Objective:  BP 110/50 mmHg  Pulse 62  Temp(Src) 97.4 F (36.3 C) (Oral)  Resp 16  Wt 147 lb (66.679 kg)  Physical Exam  Constitutional: He is oriented to person, place, and time and well-developed, well-nourished, and in no distress.  Eyes: Conjunctivae and EOM are normal. Pupils are equal, round, and reactive to light.  Neck: Normal range of motion. Neck supple.  Cardiovascular: Normal rate, regular rhythm, normal heart sounds and intact distal pulses.   Pulmonary/Chest: Effort normal and breath sounds normal.  Musculoskeletal: Normal range of motion.  Neurological: He is alert and oriented to person, place, and time. He has normal reflexes. Gait normal. GCS score is 15.  Skin: Skin is warm and dry.  Psychiatric: Mood and affect normal.    Lab Results  Component Value Date   WBC 6.5 07/25/2015   HGB 14.2 07/25/2015   HCT 43.1 07/25/2015   PLT 157 07/25/2015   GLUCOSE 98 07/25/2015   CHOL 156 09/14/2013   TRIG 77 09/14/2013   HDL 67 09/14/2013   LDLCALC 74 09/14/2013   TSH 2.68 04/02/2014   INR 2.8* 12/14/2014   HGBA1C 6.4* 09/14/2013    CMP     Component Value Date/Time   NA 139 07/25/2015 1339   NA 139 04/02/2014   K 4.4 07/25/2015 1339   CL 102 07/25/2015 1339   CO2 27 07/25/2015 1339   GLUCOSE 98 07/25/2015 1339   BUN 31* 07/25/2015 1339   BUN 30 04/15/2015 1428   CREATININE 1.58* 07/25/2015 1339   CREATININE 1.3 04/02/2014   CALCIUM 10.4* 07/25/2015 1339   AST 21 04/02/2014   ALT 16  04/02/2014   ALKPHOS 84 04/02/2014   GFRNONAA 36* 07/25/2015 1339   GFRAA 41* 07/25/2015 1339    Assessment and Plan :  1. Chronic atrial fibrillation [facility]) Patient is terrified of having a stroke so he is unwilling to stop anticoagulant. Discussed with daughter that risk due to falls may exceed benefit at some point in time. I do not think there there yet. I think he still gets benefit at this time.  2. Memory loss--Alzheimers--mild MMSE 21/30 today. Progressive issue, I think patient will probably need assisted living by the end of the year. More than 50% of visit is spent in counseling regarding these issues.  3. Bilateral conjunctivitis Refer to Ophthalmology if persistent.  Patient also complains of a dry throat. I'm not sure there is anything to do for this other than encourage water. - ciprofloxacin (CILOXAN) 0.3 % ophthalmic solution; Place 1 drop into both eyes every 4 (four) hours while awake.  Dispense: 15 mL; Refill: 0  Patient was seen and examined by Dr. Miguel Aschoff, and noted scribed by Webb Laws, [place] MD [facility] 09/09/2015 11:04 AM

## 2015-09-09 NOTE — Progress Notes (Signed)
12:28 PM   Kerry [name] 1918-11-08 [id number]  Referring provider: Jerrol Banana., MD [address] [place], [place] [address]  Chief Complaint  Patient presents with  . Follow-up    Prostate Cancer    HPI: Patient is a 82 year old Caucasian male with stage IV prostate cancer who is on intermittent ADT therapy who presents today for a two month follow up.    Previous history Patient is a 82 year old white male with prostate cancer who is on intermittent ADT therapy. He initially presented with a PSA level of 61.7 ng/mL. His bone scan completed on 05/31/2013 noted a lesion in T4 which was suspicious for metastases. He underwent four injections of Firmagon. His last PSA was 0.4 ng/mL on 07/30/2014 and his last serum testosterone was 25 ng/dL on 07/30/2014. He is taking his calcium supplements and Vitamin D.  Patient underwent a DEXA scan on 08/09/2014 and was found to have osteopenia.   At the visit on 04/02/2014, Kerry [name] was having a fair amount of fatigue and he wasn't able to engage in the activities that he enjoys. We had a long discussion with him and his daughter and decided to stop the ADT therapy and see if his fatigue improved.  Over the last few months, he states his fatigue has improved.  He is able to play golf and spend time with his wife.  He is a little frustrated at this time because he recently sold his vehicle.  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   Today, he reports good energy levels.  He is not experiencing weight loss, appetite loss or bone pain.  He has not experienced fevers, chills, nausea or vomiting.  He has no urinary symptoms at this time.  He has been diagnosed with early stage Alzheimer's and demonstrates some mild memory loss during today's visit.  His PSA has risen from  2.6 ng/mL to 4.43 ng/mL in the last four months.  His testosterone level is now 97 ng/dL.    His bone scan  completed on 05/10/2015 noted a stable focal area of increased activity upper thoracic spine and a subtle new focus of increased activity L2. This could be from degenerative change. A focal metastatic lesion cannot be excluded. No other abnormalities identified.  Patient's daughter states that he has had some spinal procedure in the past.    PMH: Past Medical History  Diagnosis Date  . Nocturia   . Neuropathy ([place])   . Incontinence   . Osteoarthritis   . Prostatitis   . Skin cancer of face   . Sleep apnea   . TIA (transient ischemic attack)   . HLD (hyperlipidemia)   . Gout   . Eye lesion   . DDD (degenerative disc disease), cervical   . BPH (benign prostatic hyperplasia)   . A-fib ([place])   . Prostate cancer ([place])   . Elevated PSA   . Alzheimer disease     Surgical History: Past Surgical History  Procedure Laterality Date  . Transurethral resection of prostate  1993    Dr. Eliberto [name]  . Hernia repair    . Mohs surgery  2015    skin cancer surgery on the face and neck    Home Medications:    Medication List       This list is accurate as of: 09/09/15 12:28 PM.  Always use your most recent med list.  bicalutamide 50 MG tablet  Commonly known as:  CASODEX  Take 1 tablet (50 mg total) by mouth daily.     calcium carbonate 600 MG Tabs tablet  Commonly known as:  OS-CAL  Take by mouth.     ciprofloxacin 0.3 % ophthalmic solution  Commonly known as:  CILOXAN  Place 1 drop into both eyes every 4 (four) hours while awake.     cyanocobalamin 1000 MCG tablet  Take by mouth.     folic acid Q000111Q MCG tablet  Commonly known as:  FOLVITE  Take by mouth.     PRESERVISION AREDS PO  Take by mouth.     PRESERVISION/LUTEIN PO  Take by mouth. Reported on 09/09/2015     Rivaroxaban 15 MG Tabs tablet  Commonly known as:  XARELTO  Take 1 tablet (15 mg total) by mouth daily with supper.        Allergies:  Allergies  Allergen Reactions  . Aricept [Donepezil]       Anxiety   . Cephalexin Itching    Family History: Family History  Problem Relation Age of Onset  . Pancreatic cancer Mother   . Throat cancer Father   . CAD Father   . Heart attack Father   . Diabetes Sister   . Heart disease Father   . Kidney disease Neg Hx   . Prostate cancer      half uncle    Social History:  reports that he has never smoked. He has never used smokeless tobacco. He reports that he does not drink alcohol or use illicit drugs.  ROS: UROLOGY Frequent Urination?: No Hard to postpone urination?: No Burning/pain with urination?: No Get up at night to urinate?: No Leakage of urine?: No Urine stream starts and stops?: No Trouble starting stream?: No Do you have to strain to urinate?: No Blood in urine?: No Urinary tract infection?: No Sexually transmitted disease?: No Injury to kidneys or bladder?: No Painful intercourse?: No Weak stream?: No Erection problems?: No Penile pain?: No  Gastrointestinal Nausea?: No Vomiting?: No Indigestion/heartburn?: No Diarrhea?: No Constipation?: No  Constitutional Fever: No Night sweats?: No Weight loss?: No Fatigue?: No  Skin Skin rash/lesions?: No Itching?: No  Eyes Blurred vision?: No Double vision?: No  Ears/Nose/Throat Sore throat?: No Sinus problems?: No  Hematologic/Lymphatic Swollen glands?: No Easy bruising?: No  Cardiovascular Leg swelling?: No Chest pain?: No  Respiratory Cough?: No Shortness of breath?: No  Endocrine Excessive thirst?: No  Musculoskeletal Back pain?: No Joint pain?: No  Neurological Headaches?: No Dizziness?: No  Psychologic Depression?: No Anxiety?: No  Physical Exam: BP 105/65 mmHg  Pulse 71  Ht 5\' 8"  (1.727 m)  Wt 145 lb 14.4 oz (66.18 kg)  BMI 22.19 kg/m2  Constitutional: Well nourished. Alert and oriented, No acute distress. HEENT: [place] AT, moist mucus membranes. Trachea midline, no masses. Cardiovascular: No clubbing, cyanosis, or  edema. Respiratory: Normal respiratory effort, no increased work of breathing. Skin: No rashes, bruises or suspicious lesions. Lymph: No cervical or inguinal adenopathy. Neurologic: Grossly intact, no focal deficits, moving all 4 extremities. Psychiatric: Normal mood and affect.  Laboratory Data: Lab Results  Component Value Date   WBC 6.5 07/25/2015   HGB 14.2 07/25/2015   HCT 43.1 07/25/2015   MCV 91.3 07/25/2015   PLT 157 07/25/2015    Lab Results  Component Value Date   CREATININE 1.58* 07/25/2015   Lab Results  Component Value Date   HGBA1C 6.4* 09/14/2013   Pertinent Imaging:  CLINICAL DATA: Prostate cancer.  EXAM: NUCLEAR MEDICINE WHOLE BODY BONE SCAN  TECHNIQUE: Whole body anterior and posterior images were obtained approximately 3 hours after intravenous injection of radiopharmaceutical.  RADIOPHARMACEUTICALS: 21.5 mCi Technetium-20m MDP IV  COMPARISON: 05/31/2013.  FINDINGS: Bilateral renal function and excretion. Stable activity in the upper thoracic. Focal area of increased activity L2. This could be from degenerative change or metastatic lesion. No other significant abnormality identified.  IMPRESSION: 1. Stable focal area of increased activity upper thoracic spine. 2. Subtle new focus of increased activity L2. This could be from degenerative change. A focal metastatic lesion cannot be excluded. No other abnormalities identified.   Electronically Signed  By: Marcello Moores Register  On: 05/10/2015 15:46       Assessment & Plan:    1. Prostate cancer:   Patient with a clinical diagnosis of prostate cancer.  No biopsy was performed.  Area of suspicion for metastasis in the T4 vertebrae and on L2 on the bone scan completed on 05/10/2015.  He is currently not receiving ADT therapy as it diminished his quality of life.  Patient's PSA has increased from 2.6 to 4.43 in the last four months.  Family and patient are still concerned with his  quality of life versus quantity and do not want to restart the ADT at this time.  We have agreed to start Casodex 50 mg daily.  He will have his testosterone and PSA repeated in one month at [facility] and an office visit.      I spent 25 minutes in a face-to-face conversation with the patient and his daughter concerning his prognosis.  Greater than 50% was spent in counseling & coordination of care with the patient.    Return in about 1 month (around 10/09/2015) for check PSA and testosterone drawn at [facility].  Zara Council, [facility] [address] [place], [place] [address] [phone]

## 2015-09-11 ENCOUNTER — Telehealth: Payer: Self-pay | Admitting: *Deleted

## 2015-09-11 NOTE — Telephone Encounter (Signed)
Spoke with patient daughter to know we rescheduled his appointment to July 18th at 9:30 am so it would be with [facility] and not on the [facility] provider schedule. Daughter did not like that day and was transferred up front to change appointment date but to keep with [facility]. Patient daughter ok with plan,call sent to [place].

## 2015-09-27 ENCOUNTER — Telehealth: Payer: Self-pay | Admitting: Emergency Medicine

## 2015-09-27 DIAGNOSIS — I4891 Unspecified atrial fibrillation: Secondary | ICD-10-CM

## 2015-09-27 MED ORDER — RIVAROXABAN 15 MG PO TABS: 15.0000 mg | ORAL_TABLET | Freq: Every day | ORAL | Status: AC

## 2015-09-27 NOTE — Telephone Encounter (Signed)
Pt requesting a refill sent to his mail order instead of [facility] because it is cheaper. Ok to do so?

## 2015-09-27 NOTE — Telephone Encounter (Signed)
Rx sent in-aa

## 2015-09-27 NOTE — Telephone Encounter (Signed)
yes

## 2015-09-29 ENCOUNTER — Telehealth: Payer: Self-pay | Admitting: Urology

## 2015-09-29 NOTE — Telephone Encounter (Signed)
Patient will need testosterone and PSA drawn at [facility] prior to his appointment with me on the 19th.

## 2015-10-04 NOTE — Telephone Encounter (Signed)
Tito Dine from [facility] called me back and gave me the fax number to use and she stated she would retrieve it from the fax and give it to the lab. RX for labs faxed and confirmation obtained.

## 2015-10-04 NOTE — Telephone Encounter (Signed)
Had to leave message on [facility] about patient and the info I need to send the rx for his lab orders to them and so it gets to the appropriate person. Waiting on response.

## 2015-10-09 ENCOUNTER — Encounter: Payer: Self-pay | Admitting: Emergency Medicine

## 2015-10-09 ENCOUNTER — Emergency Department: Payer: Medicare PPO

## 2015-10-09 ENCOUNTER — Other Ambulatory Visit: Payer: Medicare PPO

## 2015-10-09 ENCOUNTER — Emergency Department
Admission: EM | Admit: 2015-10-09 | Discharge: 2015-10-09 | Disposition: A | Discharge status: Home / Self Care | Payer: Medicare PPO | Attending: Emergency Medicine | Admitting: Emergency Medicine

## 2015-10-09 DIAGNOSIS — M1990 Unspecified osteoarthritis, unspecified site: Secondary | ICD-10-CM | POA: Diagnosis not present

## 2015-10-09 DIAGNOSIS — Z792 Long term (current) use of antibiotics: Secondary | ICD-10-CM | POA: Diagnosis not present

## 2015-10-09 DIAGNOSIS — Z8546 Personal history of malignant neoplasm of prostate: Secondary | ICD-10-CM | POA: Diagnosis not present

## 2015-10-09 DIAGNOSIS — Z79899 Other long term (current) drug therapy: Secondary | ICD-10-CM | POA: Insufficient documentation

## 2015-10-09 DIAGNOSIS — I482 Chronic atrial fibrillation: Secondary | ICD-10-CM | POA: Insufficient documentation

## 2015-10-09 DIAGNOSIS — G309 Alzheimer's disease, unspecified: Secondary | ICD-10-CM | POA: Diagnosis not present

## 2015-10-09 DIAGNOSIS — Y929 Unspecified place or not applicable: Secondary | ICD-10-CM | POA: Insufficient documentation

## 2015-10-09 DIAGNOSIS — Z8673 Personal history of transient ischemic attack (TIA), and cerebral infarction without residual deficits: Secondary | ICD-10-CM | POA: Insufficient documentation

## 2015-10-09 DIAGNOSIS — M5090 Cervical disc disorder, unspecified, unspecified cervical region: Secondary | ICD-10-CM | POA: Diagnosis not present

## 2015-10-09 DIAGNOSIS — E785 Hyperlipidemia, unspecified: Secondary | ICD-10-CM | POA: Insufficient documentation

## 2015-10-09 DIAGNOSIS — W0110XA Fall on same level from slipping, tripping and stumbling with subsequent striking against unspecified object, initial encounter: Secondary | ICD-10-CM | POA: Diagnosis not present

## 2015-10-09 DIAGNOSIS — S0990XA Unspecified injury of head, initial encounter: Secondary | ICD-10-CM | POA: Diagnosis present

## 2015-10-09 DIAGNOSIS — Y9389 Activity, other specified: Secondary | ICD-10-CM | POA: Diagnosis not present

## 2015-10-09 DIAGNOSIS — Z85828 Personal history of other malignant neoplasm of skin: Secondary | ICD-10-CM | POA: Diagnosis not present

## 2015-10-09 DIAGNOSIS — Y999 Unspecified external cause status: Secondary | ICD-10-CM | POA: Diagnosis not present

## 2015-10-09 NOTE — ED Provider Notes (Signed)
Time Seen: Approximately *1830  I have reviewed the triage notes  Chief Complaint: Fall   History of Present Illness: Kerry [name] is a 82 y.o. male who presents after he had a fall out of his bed. Patient did strike his head on the left side. He denies any syncope or loss of consciousness after his fall. He denies any other significant injury. Denies any neck, thoracic, lumbar spine pain. Patient apparently is aware of his recent falls and the patient has not had any alteration of his mental status.   Past Medical History  Diagnosis Date  . Nocturia   . Neuropathy ([place])   . Incontinence   . Osteoarthritis   . Prostatitis   . Skin cancer of face   . Sleep apnea   . TIA (transient ischemic attack)   . HLD (hyperlipidemia)   . Gout   . Eye lesion   . DDD (degenerative disc disease), cervical   . BPH (benign prostatic hyperplasia)   . A-fib ([place])   . Prostate cancer ([place])   . Elevated PSA   . Alzheimer disease     Patient Active Problem List   Diagnosis Date Noted  . Prostate cancer ([place]) 04/15/2015  . Prostate cancer metastatic to bone ([place]) 12/10/2014  . Memory loss 12/10/2014  . Adenocarcinoma of prostate ([place]) 08/09/2014  . Allergic rhinitis 08/09/2014  . Arthritis 08/09/2014  . A-fib ([place]) 08/09/2014  . Basal cell carcinoma 08/09/2014  . Benign fibroma of prostate 08/09/2014  . Benign neoplasm of colon 08/09/2014  . Narrowing of intervertebral disc space 08/09/2014  . General unsteadiness 08/09/2014  . Hypercholesteremia 08/09/2014  . Hypoxia 08/09/2014  . Malignant melanoma ([place]) 08/09/2014  . Idiopathic peripheral neuropathy ([place]) 08/09/2014  . Arthritis, degenerative 08/09/2014  . Apnea, sleep 08/09/2014  . Temporary cerebral vascular dysfunction 08/09/2014  . B12 deficiency 08/09/2014  . Atrial fibrillation ([place]) 08/09/2014  . Chronic atrial fibrillation ([place]) 04/27/2014  . Disorder of peripheral autonomic nervous system 08/17/2013    Past  Surgical History  Procedure Laterality Date  . Transurethral resection of prostate  1993    Dr. Eliberto Ivory  . Hernia repair    . Mohs surgery  2015    skin cancer surgery on the face and neck    Past Surgical History  Procedure Laterality Date  . Transurethral resection of prostate  1993    Dr. Eliberto Ivory  . Hernia repair    . Mohs surgery  2015    skin cancer surgery on the face and neck    Current Outpatient Rx  Name  Route  Sig  Dispense  Refill  . bicalutamide (CASODEX) 50 MG tablet   Oral   Take 1 tablet (50 mg total) by mouth daily.   30 tablet   12   . calcium carbonate (OS-CAL) 600 MG TABS tablet   Oral   Take by mouth.         . ciprofloxacin (CILOXAN) 0.3 % ophthalmic solution   Both Eyes   Place 1 drop into both eyes every 4 (four) hours while awake.   15 mL   0   . cyanocobalamin 1000 MCG tablet   Oral   Take by mouth.         . folic acid (FOLVITE) Q000111Q MCG tablet   Oral   Take by mouth.         . Multiple Vitamins-Minerals (PRESERVISION AREDS PO)   Oral   Take by mouth.         Marland Kitchen  Multiple Vitamins-Minerals (PRESERVISION/LUTEIN PO)   Oral   Take by mouth. Reported on 09/09/2015         . Rivaroxaban (XARELTO) 15 MG TABS tablet   Oral   Take 1 tablet (15 mg total) by mouth daily with supper.   90 tablet   3     Allergies:  Aricept and Cephalexin  Family History: Family History  Problem Relation Age of Onset  . Pancreatic cancer Mother   . Throat cancer Father   . CAD Father   . Heart attack Father   . Diabetes Sister   . Heart disease Father   . Kidney disease Neg Hx   . Prostate cancer      half uncle    Social History: Social History  Substance Use Topics  . Smoking status: Never Smoker   . Smokeless tobacco: Never Used  . Alcohol Use: No     Review of Systems:   10 point review of systems was performed and was otherwise negative:  Constitutional: No fever Eyes: No visual disturbances ENT: No sore throat, ear  pain Cardiac: No chest pain Respiratory: No shortness of breath, wheezing, or stridor Abdomen: No abdominal pain, no vomiting, No diarrhea Endocrine: No weight loss, No night sweats Extremities: No peripheral edema, cyanosis Skin: No rashes, easy bruising Neurologic: No focal weakness, trouble with speech or swollowing Urologic: No dysuria, Hematuria, or urinary frequency   Physical Exam:  ED Triage Vitals  Enc Vitals Group     BP 10/09/15 1808 137/52 mmHg     Pulse Rate 10/09/15 1808 60     Resp 10/09/15 1808 18     Temp 10/09/15 1808 97.5 F (36.4 C)     Temp src --      SpO2 10/09/15 1808 99 %     Weight 10/09/15 1808 145 lb (65.772 kg)     Height 10/09/15 1808 5\' 9"  (1.753 m)     Head Cir --      Peak Flow --      Pain Score 10/09/15 1815 0     Pain Loc --      Pain Edu? --      Excl. in [place]? --     General: Awake , Alert , and Oriented times 2 patient was off on the date though he was able to answer most questions appropriately. Glascow coma scale 15 Head: Normal cephalic , he has a hematoma over the left frontal parietal region without crepitus or step-off noted. Midface is stable Eyes: Pupils equal , round, reactive to light Nose/Throat: No nasal drainage, patent upper airway without erythema or exudate.  Neck: Supple, Full range of motion, No anterior adenopathy or palpable thyroid masses. Patient has good flexion, extension and rotation without any pain or neurapraxia. Lungs: Clear to ascultation without wheezes , rhonchi, or rales Heart: Regular rate, regular rhythm without murmurs , gallops , or rubs Abdomen: Soft, non tender without rebound, guarding , or rigidity; bowel sounds positive and symmetric in all 4 quadrants. No organomegaly .        Extremities: 2 plus symmetric pulses. No edema, clubbing or cyanosis Neurologic: normal ambulation, Motor symmetric without deficits, sensory intact Skin: warm, dry, no rashes     Radiology:    CT HEAD WO CONTRAST  (Final result) Result time: 10/09/15 19:35:05   Final result by Rad Results In Interface (10/09/15 19:35:05)   Narrative:   CLINICAL DATA: Pt presents to ED with reports of falling off the  bed today and hitting his head. Pt states was sitting on bed and slipped off hitting the left side of his head. Pt denies LOC. Pt is a resident of [facility]. Pt c.*comment was truncated*  EXAM: CT HEAD WITHOUT CONTRAST  TECHNIQUE: Contiguous axial images were obtained from the base of the skull through the vertex without intravenous contrast.  COMPARISON: None.  FINDINGS: No intracranial hemorrhage. No parenchymal contusion. No midline shift or mass effect. Basilar cisterns are patent. No skull base fracture. No fluid in the paranasal sinuses or mastoid air cells. Orbits are normal.  There are periventricular and subcortical white matter hypodensities. Generalized cortical atrophy.  Scalp hematoma over the LEFT frontal bone. No fracture.  IMPRESSION: 1. No intracranial trauma. 2. Shallow scalp hematoma over the LEFT frontal bone. 3. Atrophy and white matter microvascular disease.   Electronically Signed By: Suzy Bouchard M.D. On: 10/09/2015 19:35       I personally reviewed the radiologic studies   P ED Course:  Patient's stay here was uneventful and the patient remained hemodynamically stable with a normal mental status. His head CT does not show any significant injury. Patient denies any other injuries and I felt could be transferred back to his home. His family appears willing to take him home on their own.    Assessment: * Non-syncopal fall with acute closed head injury     Plan: * Outpatient Patient was advised to return immediately if condition worsens. Patient was advised to follow up with their primary care physician or other specialized physicians involved in their outpatient care. The patient and/or family member/power of attorney  had laboratory results reviewed at the bedside. All questions and concerns were addressed and appropriate discharge instructions were distributed by the nursing staff.             Daymon Larsen, MD 10/09/15 [phone]

## 2015-10-09 NOTE — ED Notes (Signed)
Pt presents to ED with reports of falling off the bed today and hitting his head. Pt states was sitting on bed and slipped off hitting the left side of his head. Pt denies LOC. Pt is a resident of [facility]. Pt caregiver is with him. [place] reports three falls within the last two weeks. Pt alert and disoriented to date but otherwise oriented.

## 2015-10-11 ENCOUNTER — Telehealth: Payer: Self-pay

## 2015-10-11 NOTE — Telephone Encounter (Signed)
Daughter, Caren Griffins, called and states patient fell 7/12 and hit his head and they did CT scan. He is at [facility] and they wanted to get order faxed over to get PT and OT evaluation for patient and also order for walker sent to them and they will take care of it all. Patient has dementia and while he is agreeing to all of this she wants to get the process started ASAP, can we write the orders for this today or does it need to wait till Dr Rosanna Randy comes on Monday?-aa Cynthia's call back is [phone] Fax to [place] is [phone]

## 2015-10-11 NOTE — Telephone Encounter (Signed)
Per [name] OK to order PT/OT in his name. Thanks.

## 2015-10-11 NOTE — Telephone Encounter (Signed)
Orders faxed, daughter informed.

## 2015-10-15 ENCOUNTER — Ambulatory Visit: Payer: Medicare PPO | Admitting: Urology

## 2015-10-15 ENCOUNTER — Ambulatory Visit (INDEPENDENT_AMBULATORY_CARE_PROVIDER_SITE_OTHER): Payer: Medicare PPO | Admitting: Urology

## 2015-10-15 VITALS — BP 104/61 | HR 64 | Ht 67.0 in | Wt 145.0 lb

## 2015-10-15 DIAGNOSIS — C61 Malignant neoplasm of prostate: Secondary | ICD-10-CM | POA: Diagnosis not present

## 2015-10-15 DIAGNOSIS — N183 Chronic kidney disease, stage 3 unspecified: Secondary | ICD-10-CM

## 2015-10-15 DIAGNOSIS — R310 Gross hematuria: Secondary | ICD-10-CM

## 2015-10-15 LAB — MICROSCOPIC EXAMINATION
Bacteria, UA: NONE SEEN
Epithelial Cells (non renal): NONE SEEN /hpf (ref 0–10)
RBC, UA: >30 /hpf — AB (ref 0–?)
WBC UA: NONE SEEN /HPF (ref 0–?)

## 2015-10-15 LAB — URINALYSIS, COMPLETE
BILIRUBIN UA: NEGATIVE
Glucose, UA: NEGATIVE
Ketones, UA: NEGATIVE
Leukocytes, UA: NEGATIVE
Nitrite, UA: NEGATIVE
PH UA: 5 (ref 5.0–7.5)
SPEC GRAV UA: 1.02 (ref 1.005–1.030)
Urobilinogen, Ur: 0.2 mg/dL (ref 0.2–1.0)

## 2015-10-15 NOTE — Progress Notes (Signed)
10/15/2015 2:43 PM   Kerry [name] September 18, 1918 [id number]  Referring provider: Jerrol Banana., MD [address] [place], [place] [address]  Chief Complaint  Patient presents with  . Prostate Cancer    17month  . Hematuria    HPI:  Extremely pleasant 82 year old male with stage IV prostate cancer on intermittent ADT who returns to the office today accompanied by his daughter to discuss his treatment plan for his prostate cancer along development of new onset gross hematuria.    Prostate cancer Clinically diagnosed prostate cancer, presenting in 05/31/2013 with PSA of 61.7 ng/mL. At that time further workup with bone scan was suspicious for metastatic disease involving T4 which maintained relatively stable. She underwent 4 injections of firmagon achieving castrate levels with a PSA nadir of 0.4 ng/dL.  Partially, he developed significant and profound fatigue and elected to stop ADT on 03/2014.  Since that time, his PSA has slowly risen most recently to 4.43 from 2.6 over a 4 month period. His testosterone levels are slowly recovering but he still remains hypogonadal but not castrate.    Recent imaging includes a bone scan on 05/2015 which showed stable activity of the thoracic spine and a subtle new focus of increased activity on L2 which could reflect degenerative versus metastatic change.  Casodex only was considered but after reviewing the possible side effects, he and his daughters have elected not to pursue this therapy.  He continues to work with physical therapy to regain his strength and continue to enjoy the things that he did previously. His primary focus at this point his quality of life.  He denies any significant urinary symptoms including no dysuria, gross hematuria, frequency or urgency. Nocturia x 1.  Recent onset of gross hematuria as below.  Gross hematuria Patient endorses new onset of painless gross hematuria over the past few weeks. He reports  that this is mostly light pink or cranberry colored without clots. He is able to empty his bladder sufficiently for comfort.  His also witness on the side of the toilet bowl by one of his daughters.  He denies any UTI type symptoms including dysuria, urinary frequency or urgency. No fevers or chills.  He is on Xarelto for A. Fib and has had numerous falls including head trauma recently. He's been advised by his PCP, [name], to stop this medication. He is extremely anxious and unwilling to stop this medication as his wife had an unfortunate ischemic stroke with significant sequela when coming off of her anticoagulation.  Bone health DEXA on 08/09/2014 consistent with osteopenia. He has been taking calcium and vitamin D supplementation. He's been working with physical therapy performing weightbearing exercises. He enjoys golf on a fairly frequent basis in the summertime.  History of BPH S/p TURP '93  CKD Most recent laboratory data 2 months ago shows a slowly rising creatinine to 1.58 from 1.3 one year ago.   Medical comorbidities are significant for mild Alzheimer's dementia.  PMH: Past Medical History  Diagnosis Date  . Nocturia   . Neuropathy ([place])   . Incontinence   . Osteoarthritis   . Prostatitis   . Skin cancer of face   . Sleep apnea   . TIA (transient ischemic attack)   . HLD (hyperlipidemia)   . Gout   . Eye lesion   . DDD (degenerative disc disease), cervical   . BPH (benign prostatic hyperplasia)   . A-fib ([place])   . Prostate cancer ([place])   .  Elevated PSA   . Alzheimer disease     Surgical History: Past Surgical History  Procedure Laterality Date  . Transurethral resection of prostate  1993    Dr. Eliberto Ivory  . Hernia repair    . Mohs surgery  2015    skin cancer surgery on the face and neck    Home Medications:    Medication List       This list is accurate as of: 10/15/15 11:59 PM.  Always use your most recent med list.               bicalutamide  50 MG tablet  Commonly known as:  CASODEX  Take 1 tablet (50 mg total) by mouth daily.     calcium carbonate 600 MG Tabs tablet  Commonly known as:  OS-CAL  Take by mouth.     cyanocobalamin 1000 MCG tablet  Take by mouth.     folic acid Q000111Q MCG tablet  Commonly known as:  FOLVITE  Take by mouth.     PRESERVISION AREDS PO  Take by mouth.     PRESERVISION/LUTEIN PO  Take by mouth. Reported on 09/09/2015     Rivaroxaban 15 MG Tabs tablet  Commonly known as:  XARELTO  Take 1 tablet (15 mg total) by mouth daily with supper.        Allergies:  Allergies  Allergen Reactions  . Aricept [Donepezil]     Anxiety   . Cephalexin Itching    Family History: Family History  Problem Relation Age of Onset  . Pancreatic cancer Mother   . Throat cancer Father   . CAD Father   . Heart attack Father   . Diabetes Sister   . Heart disease Father   . Kidney disease Neg Hx   . Prostate cancer      half uncle    Social History:  reports that he has never smoked. He has never used smokeless tobacco. He reports that he does not drink alcohol or use illicit drugs.  ROS: UROLOGY Frequent Urination?: No Hard to postpone urination?: No Burning/pain with urination?: No Get up at night to urinate?: Yes Leakage of urine?: No Urine stream starts and stops?: No Trouble starting stream?: No Do you have to strain to urinate?: No Blood in urine?: Yes Urinary tract infection?: No Sexually transmitted disease?: No Injury to kidneys or bladder?: No Painful intercourse?: No Weak stream?: No Erection problems?: No Penile pain?: No  Gastrointestinal Nausea?: No Vomiting?: No Indigestion/heartburn?: No Diarrhea?: No Constipation?: No  Constitutional Fever: No Night sweats?: No Weight loss?: No Fatigue?: No  Skin Skin rash/lesions?: No Itching?: No  Eyes Blurred vision?: No Double vision?: No  Ears/Nose/Throat Sore throat?: No Sinus problems?:  No  Hematologic/Lymphatic Swollen glands?: No Easy bruising?: No  Cardiovascular Leg swelling?: No Chest pain?: No  Respiratory Cough?: No Shortness of breath?: No  Endocrine Excessive thirst?: No  Musculoskeletal Back pain?: No Joint pain?: No  Neurological Headaches?: No Dizziness?: No  Psychologic Depression?: No Anxiety?: No  Physical Exam: BP 104/61 mmHg  Pulse 64  Ht 5\' 7"  (1.702 m)  Wt 145 lb (65.772 kg)  BMI 22.71 kg/m2  Constitutional:  Alert and oriented x 2 (does no know current date), No acute distress.  Drowsy and not off at times during our conversation.  Accompanied today by his daughter.  HEENT: [place] AT, moist mucus membranes.  Trachea midline, no masses. Cardiovascular: No clubbing, cyanosis, or edema. Respiratory: Normal respiratory effort, no increased  work of breathing. Skin: No rashes, bruises or suspicious lesions. Neurologic: Grossly intact, no focal deficits, moving all 4 extremities.  Ambulating with walker. Mild lower extremity weakness appreciated.  Psychiatric: Normal mood and affect.  Laboratory Data: Lab Results  Component Value Date   WBC 6.5 07/25/2015   HGB 14.2 07/25/2015   HCT 43.1 07/25/2015   MCV 91.3 07/25/2015   PLT 157 07/25/2015    Lab Results  Component Value Date   CREATININE 1.36* 10/15/2015    Lab Results  Component Value Date   TESTOSTERONE 89* 10/15/2015     Component     Latest Ref Rng 12/10/2014 04/10/2015 10/15/2015  PSA     0.0 - 4.0 ng/mL 1.7 2.6 6.9 (H)     Lab Results  Component Value Date   HGBA1C 6.4* 09/14/2013    Urinalysis Results for orders placed or performed in visit on 10/15/15  Microscopic Examination  Result Value Ref Range   WBC, UA None seen 0 -  5 /hpf   RBC, UA >30 (A) 0 -  2 /hpf   Epithelial Cells (non renal) None seen 0 - 10 /hpf   Bacteria, UA None seen None seen/Few  Urinalysis, Complete  Result Value Ref Range   Specific Gravity, UA 1.020 1.005 - 1.030   pH, UA  5.0 5.0 - 7.5   Color, UA Yellow Yellow   Appearance Ur Cloudy (A) Clear   Leukocytes, UA Negative Negative   Protein, UA 2+ (A) Negative/Trace   Glucose, UA Negative Negative   Ketones, UA Negative Negative   RBC, UA 3+ (A) Negative   Bilirubin, UA Negative Negative   Urobilinogen, Ur 0.2 0.2 - 1.0 mg/dL   Nitrite, UA Negative Negative   Microscopic Examination See below:   Basic metabolic panel  Result Value Ref Range   Glucose 94 65 - 99 mg/dL   BUN 32 10 - 36 mg/dL   Creatinine, Ser 1.36 (H) 0.76 - 1.27 mg/dL   GFR calc non Af Amer 44 (L) >59 mL/min/1.73   GFR calc Af Amer 51 (L) >59 mL/min/1.73   BUN/Creatinine Ratio 24 10 - 24   Sodium 137 134 - 144 mmol/L   Potassium 5.3 (H) 3.5 - 5.2 mmol/L   Chloride 97 96 - 106 mmol/L   CO2 23 18 - 29 mmol/L   Calcium 10.3 (H) 8.6 - 10.2 mg/dL  PSA  Result Value Ref Range   Prostate Specific Ag, Serum 6.9 (H) 0.0 - 4.0 ng/mL  Testosterone  Result Value Ref Range   Testosterone 89 (L) 264 - 916 ng/dL    Pertinent Imaging: Study Result     CLINICAL DATA: Prostate cancer.  EXAM: NUCLEAR MEDICINE WHOLE BODY BONE SCAN  TECHNIQUE: Whole body anterior and posterior images were obtained approximately 3 hours after intravenous injection of radiopharmaceutical.  RADIOPHARMACEUTICALS: 21.5 mCi Technetium-42m MDP IV  COMPARISON: 05/31/2013.  FINDINGS: Bilateral renal function and excretion. Stable activity in the upper thoracic. Focal area of increased activity L2. This could be from degenerative change or metastatic lesion. No other significant abnormality identified.  IMPRESSION: 1. Stable focal area of increased activity upper thoracic spine. 2. Subtle new focus of increased activity L2. This could be from degenerative change. A focal metastatic lesion cannot be excluded. No other abnormalities identified.   Electronically Signed  By: Marcello Moores Register  On: 05/10/2015 15:46    Scan personally reviewed  today and with the patient's daughter.  Assessment & Plan:    1. Prostate  cancer ([place]) PSA and testosterone repeated today, rising as expected in absence of androgen deprivation therapy Lengthy discussion today with the patient and his daughter about the natural history of prostate cancer if untreated. We discussed both progressing metastatic and locally invasive disease. Patient and his daughter continued to feel that his quality of life is most important and that irrigation deprivation therapy severely inhibits his ability to enjoy his activities. As such, they have elected to abstain from further treatment at this time. We did discuss today that as the cancer progresses, we may need to make certain decisions about how aggressive to be in the setting of possible urinary retention, ureteral obstruction, etc. I have recommended a noncontrast CT abdomen and pelvis (nonconstrasat given CKD) for further staging and to get a sense of the degree of local invasion in the setting of new onset gross hematuria which is likely secondary to prostate cancer Follow up thereafter to review results - CT ABDOMEN PELVIS WO CONTRAST; Future - PSA - Testosterone - CULTURE, URINE COMPREHENSIVE  2. Gross hematuria He lives gross hematuria in the absence of evidence of infection I suspect this may be related to progressing prostate cancer in the setting of anticoagulation Explained that without cystoscopy, unable to rule out other etiologies His age, he would like to defer cystoscopy as he would not elect to treat any possible finding such as bladder cancer Agree with cessation of Luanna Salk as this is certainly exacerbating his bleeding and makes him high risk for hematoma given his multiple recent falls- unwilling to stop at this time - Urinalysis, Complete  3. CKD (chronic kidney disease) stage 3, GFR 30-59 ml/min Slowly worsening renal function, we will repeat today Concern for urinary obstruction - Basic  metabolic panel    Return in about 2 weeks (around 10/29/2015) for f/u CT scan/ BMP.  Hollice Espy, MD  [facility] [address] [place],  [address] [phone]  I spent 40 min with this patient of which greater than 50% was spent in counseling and coordination of care with the patient.

## 2015-10-16 ENCOUNTER — Encounter: Payer: Self-pay | Admitting: Urology

## 2015-10-16 ENCOUNTER — Ambulatory Visit: Payer: Medicare PPO | Admitting: Urology

## 2015-10-16 LAB — BASIC METABOLIC PANEL
BUN/Creatinine Ratio: 24 (ref 10–24)
BUN: 32 mg/dL (ref 10–36)
CALCIUM: 10.3 mg/dL — AB (ref 8.6–10.2)
CHLORIDE: 97 mmol/L (ref 96–106)
CO2: 23 mmol/L (ref 18–29)
Creatinine, Ser: 1.36 mg/dL — ABNORMAL HIGH (ref 0.76–1.27)
GFR calc Af Amer: 51 mL/min/{1.73_m2} — ABNORMAL LOW (ref >59)
GFR, EST NON AFRICAN AMERICAN: 44 mL/min/{1.73_m2} — AB (ref >59)
Glucose: 94 mg/dL (ref 65–99)
POTASSIUM: 5.3 mmol/L — AB (ref 3.5–5.2)
Sodium: 137 mmol/L (ref 134–144)

## 2015-10-16 LAB — PSA: Prostate Specific Ag, Serum: 6.9 ng/mL — ABNORMAL HIGH (ref 0.0–4.0)

## 2015-10-16 LAB — TESTOSTERONE: Testosterone: 89 ng/dL — ABNORMAL LOW (ref 264–916)

## 2015-10-17 LAB — CULTURE, URINE COMPREHENSIVE

## 2015-10-29 ENCOUNTER — Ambulatory Visit
Admission: RE | Admit: 2015-10-29 | Discharge: 2015-10-29 | Disposition: A | Discharge status: Home / Self Care | Payer: Medicare PPO | Source: Ambulatory Visit | Attending: Urology | Admitting: Urology

## 2015-10-29 DIAGNOSIS — C61 Malignant neoplasm of prostate: Secondary | ICD-10-CM | POA: Insufficient documentation

## 2015-10-29 DIAGNOSIS — R935 Abnormal findings on diagnostic imaging of other abdominal regions, including retroperitoneum: Secondary | ICD-10-CM | POA: Diagnosis not present

## 2015-11-08 ENCOUNTER — Encounter: Payer: Self-pay | Admitting: Urology

## 2015-11-08 ENCOUNTER — Telehealth: Payer: Self-pay | Admitting: Family Medicine

## 2015-11-08 ENCOUNTER — Ambulatory Visit (INDEPENDENT_AMBULATORY_CARE_PROVIDER_SITE_OTHER): Payer: Medicare PPO | Admitting: Urology

## 2015-11-08 VITALS — BP 94/53 | HR 61 | Ht 67.0 in | Wt 147.1 lb

## 2015-11-08 DIAGNOSIS — C61 Malignant neoplasm of prostate: Secondary | ICD-10-CM | POA: Diagnosis not present

## 2015-11-08 DIAGNOSIS — R310 Gross hematuria: Secondary | ICD-10-CM

## 2015-11-08 DIAGNOSIS — N183 Chronic kidney disease, stage 3 unspecified: Secondary | ICD-10-CM

## 2015-11-08 DIAGNOSIS — Z111 Encounter for screening for respiratory tuberculosis: Secondary | ICD-10-CM

## 2015-11-08 NOTE — Telephone Encounter (Signed)
Pt's daughter Ivin Booty schedule a f/u appt on Thursday 11/14/15 to have form completed and pt needs to have a TB test done. Ivin Booty would like to get a lab slip prior to the OV so the results can be back in time of the appt. Please advise. Thanks TNP

## 2015-11-08 NOTE — Progress Notes (Signed)
11/08/2015 6:16 PM   Kerry [name] 1919/02/18 [id number]  Referring provider: Jerrol Banana., MD [address] [place], [place] [address]  Chief Complaint  Patient presents with  . Follow-up    CT results    HPI:  Extremely pleasant 82 year old male with stage IV prostate cancer on intermittent ADT who returns to the office today accompanied by his daughter for f/u staging imaging.  Unfortunately since our last visit, he lost his wife struggling with depression.  Prostate cancer Clinically diagnosed prostate cancer, presenting in 05/31/2013 with PSA of 61.7 ng/mL. At that time further workup with bone scan was suspicious for metastatic disease involving T4 which maintained relatively stable. She underwent 4 injections of firmagon achieving castrate levels with a PSA nadir of 0.4 ng/dL.  Initally, he developed significant and profound fatigue and elected to stop ADT on 03/2014. Casodex also considered but concerned about side effects.  Since that time, his PSA has slowly risen most recently to 4.43 from 2.6 over a 4 month period and again to 6.9 on 10/15/15. His testosterone levels are slowly recovering but he still remains hypogonadal but not castrate.    Recent imaging includes a bone scan on 05/2015 which showed stable activity of the thoracic spine and a subtle new focus of increased activity on L2 which could reflect degenerative versus metastatic change.  Noncontrast CT scan ordered on 10/29/2015 for the purpose of staging reveals no evidence of metastatic disease.  Primary goals are quality-of-life. He continues to desire to abstain from further treatment at this time.    Denies an significant urinary issues or worsened bone pain  Gross hematuria Previously having painless gross hematuria, none  Since last visit.    He is on Xarelto for A. Fib and has had numerous falls including head trauma recently. He's been advised by his PCP, [name], to stop  this medication. He is extremely anxious and unwilling to stop this medication as his wife had an unfortunate ischemic stroke with significant sequela when coming off of her anticoagulation.  Bone health DEXA on 08/09/2014 consistent with osteopenia. He has been taking calcium and vitamin D supplementation. He's been working with physical therapy performing weightbearing exercises. He enjoys golf on a fairly frequent basis in the summertime.  History of BPH S/p TURP '93  CKD Most recent laboratory data 2 months ago shows a slowly rising creatinine to 1.58 from 1.3 one year ago.  Repeat blood work shows interval improvement in Cr to 1.36 back to baseline.     Medical comorbidities are significant for mild Alzheimer's dementia.  PMH: Past Medical History:  Diagnosis Date  . A-fib ([place])   . Alzheimer disease   . BPH (benign prostatic hyperplasia)   . DDD (degenerative disc disease), cervical   . Elevated PSA   . Eye lesion   . Gout   . HLD (hyperlipidemia)   . Incontinence   . Neuropathy ([place])   . Nocturia   . Osteoarthritis   . Prostate cancer ([place])   . Prostatitis   . Skin cancer of face   . Sleep apnea   . TIA (transient ischemic attack)     Surgical History: Past Surgical History:  Procedure Laterality Date  . HERNIA REPAIR    . MOHS SURGERY  2015   skin cancer surgery on the face and neck  . TRANSURETHRAL RESECTION OF PROSTATE  1993   Dr. Rockholds Medications:    Medication List  Accurate as of 11/08/15  6:16 PM. Always use your most recent med list.          calcium carbonate 600 MG Tabs tablet Commonly known as:  OS-CAL Take by mouth.   cyanocobalamin 1000 MCG tablet Take by mouth.   folic acid Q000111Q MCG tablet Commonly known as:  FOLVITE Take by mouth.   PRESERVISION AREDS PO Take by mouth.   PRESERVISION/LUTEIN PO Take by mouth. Reported on 09/09/2015   Rivaroxaban 15 MG Tabs tablet Commonly known as:  XARELTO Take 1 tablet (15 mg  total) by mouth daily with supper.       Allergies:  Allergies  Allergen Reactions  . Aricept [Donepezil]     Anxiety   . Cephalexin Itching    Family History: Family History  Problem Relation Age of Onset  . Pancreatic cancer Mother   . Throat cancer Father   . CAD Father   . Heart attack Father   . Diabetes Sister   . Heart disease Father   . Kidney disease Neg Hx   . Prostate cancer      half uncle    Social History:  reports that he has never smoked. He has never used smokeless tobacco. He reports that he does not drink alcohol or use drugs.  ROS: UROLOGY Frequent Urination?: No Hard to postpone urination?: No Burning/pain with urination?: No Get up at night to urinate?: No Leakage of urine?: No Urine stream starts and stops?: No Trouble starting stream?: No Do you have to strain to urinate?: No Blood in urine?: No Urinary tract infection?: No Sexually transmitted disease?: No Injury to kidneys or bladder?: No Painful intercourse?: No Weak stream?: No Erection problems?: No Penile pain?: No  Gastrointestinal Nausea?: No Vomiting?: No Indigestion/heartburn?: No Diarrhea?: No Constipation?: No  Constitutional Fever: No Night sweats?: No Weight loss?: No Fatigue?: No  Skin Skin rash/lesions?: No Itching?: No  Eyes Blurred vision?: No Double vision?: No  Ears/Nose/Throat Sore throat?: No Sinus problems?: No  Hematologic/Lymphatic Swollen glands?: No Easy bruising?: No  Cardiovascular Leg swelling?: No Chest pain?: No  Respiratory Cough?: No Shortness of breath?: No  Endocrine Excessive thirst?: No  Musculoskeletal Back pain?: No Joint pain?: No  Neurological Headaches?: No Dizziness?: No  Psychologic Depression?: No Anxiety?: No  Physical Exam: BP (!) 94/53 (BP Location: Left Arm, Patient Position: Sitting, Cuff Size: Normal)   Pulse 61   Ht 5\' 7"  (1.702 m)   Wt 147 lb 1.6 oz (66.7 kg)   BMI 23.04 kg/m     Constitutional:  Alert and oriented x 2 (does no know current date), No acute distress.  Accompanied today by his daughter.  HEENT: [place] AT, moist mucus membranes.  Trachea midline, no masses. Cardiovascular: No clubbing, cyanosis, or edema. Respiratory: Normal respiratory effort, no increased work of breathing. Skin: No rashes or suspicious lesions.  Resolving left scalp hematoma appreciated. Neurologic: Grossly intact, no focal deficits, moving all 4 extremities.  Ambulating with walker. Mild lower extremity weakness appreciated.  Psychiatric: Normal mood and affect.  Laboratory Data: Lab Results  Component Value Date   WBC 6.5 07/25/2015   HGB 14.2 07/25/2015   HCT 43.1 07/25/2015   MCV 91.3 07/25/2015   PLT 157 07/25/2015    Lab Results  Component Value Date   CREATININE 1.36 (H) 10/15/2015    Lab Results  Component Value Date   TESTOSTERONE 89 (L) 10/15/2015     Component     Latest Ref Rng 12/10/2014  04/10/2015 10/15/2015  PSA     0.0 - 4.0 ng/mL 1.7 2.6 6.9 (H)     Lab Results  Component Value Date   HGBA1C 6.4 (A) 09/14/2013     Pertinent Imaging: Study Result   CLINICAL DATA:  History of prostate carcinoma  EXAM: CT ABDOMEN AND PELVIS WITHOUT CONTRAST  TECHNIQUE: Multidetector CT imaging of the abdomen and pelvis was performed following the standard protocol without IV contrast.  COMPARISON:  None.  FINDINGS: Lower chest: Bilateral lower lobe interstitial reticulation is identified. Mild subpleural honeycombing is suspected. No lung opacities. No pleural effusion.  Hepatobiliary: 6 mm low-attenuation structure scratch set there are several low density structures within the spleen. The largest is in the left lobe of liver measuring 7 mm. These are too small to reliably characterize. The gallbladder appears normal. No biliary dilatation.  Pancreas: Normal appearance of the pancreas.  Spleen: The spleen is  unremarkable.  Adrenals/Urinary Tract: Unremarkable appearance of the right adrenal gland. Low attenuation nodule in the left adrenal gland measures 1.6 cm, image 18 of series 2. Likely benign adenoma. There is a right kidney cyst measuring 1.9 cm. This is incompletely characterized without IV contrast. Left kidney is unremarkable. The urinary bladder appears normal.  Stomach/Bowel: Normal appearance of the stomach. The small bowel loops have a normal course and caliber. There is no bowel obstruction. Distal colonic diverticula noted without acute inflammation.  Vascular/Lymphatic: Calcified atherosclerotic disease involves the abdominal aorta. No aneurysm. No enlarged upper abdominal lymph nodes identified. No pelvic or inguinal adenopathy.  Reproductive: Prostate gland is enlarged and there is a defect from presumed prior TURP.  Other: There is no ascites or focal fluid collections within the abdomen or pelvis.  Musculoskeletal: Degenerative disc disease is noted within the lumbar spine and pelvis. No aggressive lytic or sclerotic bone lesions identified. Well-circumscribed sclerotic structure within the L2 vertebra measures 8 mm, image 60 of series 6.  IMPRESSION: 1. No evidence for metastatic adenopathy within the abdomen or pelvis. 2. Solitary sclerotic focus within the L2 vertebra measuring 8 mm. Statistically likely a benign bone island. Considering the well defined, nonaggressive appearance of this lesion bone metastasis is considered less favored. Nevertheless, given the patient's history of prostate cancer correlation with PSA and followup imaging should be considered. 3. Bilateral lower lobe interstitial reticulation and possible subpleural honeycombing. Cannot rule out early interstitial lung disease. High-resolution CT of the chest and pulmonary function tests may be considered for further workup.   Electronically Signed   By: Kerby Moors M.D.    On: 10/29/2015 13:15      CT Scan personally reviewed today and with the patient's daughter.  Assessment & Plan:    1. Prostate cancer [facility]) Imaging is reassuring although rapid PSA doubling time is somewhat concerning for metabolically active/progressive disease. Lengthy discussion again today with the patient's daughter about goals of care. At this point, quality of life is of utmost importance to them therefore they're not interested in ADT or any other treatment for his cancer unless he becomes significantly symptomatic. We discussed the natural history of prostate cancer and signs or symptoms of progressive disease today such as signs of renal failure, bone pain, fatigue, voiding symptoms, amongst others.  He will follow up as needed when he becomes symptomatic for reconsideration of possible treatment at that point. He does daughter both agreeable with this plan.  2. Gross hematuria Asymptomatic gross hematuria in the absence of evidence of infection, denies further episodes since last  visi I suspect this may be related to progressing prostate cancer in the setting of anticoagulation Explained that without cystoscopy, unable to rule out other etiologies His age, he would like to defer cystoscopy as he would not elect to treat any possible finding such as bladder cancer Agree with cessation of Luanna Salk as this is certainly exacerbating his bleeding and makes him high risk for hematoma given his multiple recent falls- unwilling to stop at this time  3. CKD (chronic kidney disease) stage 3, GFR 30-59 ml/min Stable at baseline    Return if symptoms worsen or fail to improve.  Hollice Espy, MD  [facility] [address], [place], [place] [address] [phone]

## 2015-11-11 NOTE — Telephone Encounter (Signed)
ok 

## 2015-11-11 NOTE — Telephone Encounter (Signed)
Ok to order this for him?

## 2015-11-12 NOTE — Telephone Encounter (Signed)
Pt informed and voiced understanding of results. Will pick up at appt

## 2015-11-14 ENCOUNTER — Encounter: Payer: Self-pay | Admitting: Family Medicine

## 2015-11-14 ENCOUNTER — Ambulatory Visit (INDEPENDENT_AMBULATORY_CARE_PROVIDER_SITE_OTHER): Payer: Medicare PPO | Admitting: Family Medicine

## 2015-11-14 VITALS — BP 110/52 | HR 64 | Temp 97.7°F | Resp 16 | Wt 149.0 lb

## 2015-11-14 DIAGNOSIS — I482 Chronic atrial fibrillation, unspecified: Secondary | ICD-10-CM

## 2015-11-14 DIAGNOSIS — R413 Other amnesia: Secondary | ICD-10-CM | POA: Diagnosis not present

## 2015-11-14 DIAGNOSIS — F4321 Adjustment disorder with depressed mood: Secondary | ICD-10-CM

## 2015-11-14 NOTE — Progress Notes (Signed)
Subjective:  HPI Pt is here to have forms completed for a day program at [facility]. He is also grieving the death of his wife of 70+ years. He reports that other than grieving he is feeling ok. " I am ok physically, but mentally, I think i'm still in shock".  Prior to Admission medications   Medication Sig Start Date End Date Taking? Authorizing Provider  calcium carbonate (OS-CAL) 600 MG TABS tablet Take by mouth.   Yes Historical Provider, MD  cyanocobalamin 1000 MCG tablet Take by mouth. 10/03/12  Yes Historical Provider, MD  folic acid (FOLVITE) Q000111Q MCG tablet Take by mouth.   Yes Historical Provider, MD  Multiple Vitamins-Minerals (PRESERVISION AREDS PO) Take by mouth.   Yes Historical Provider, MD  Rivaroxaban (XARELTO) 15 MG TABS tablet Take 1 tablet (15 mg total) by mouth daily with supper. 09/27/15  Yes Jaspreet Hollings Maceo Pro., MD    Patient Active Problem List   Diagnosis Date Noted  . Prostate cancer ([place]) 04/15/2015  . Prostate cancer metastatic to bone ([place]) 12/10/2014  . Memory loss 12/10/2014  . Adenocarcinoma of prostate ([place]) 08/09/2014  . Allergic rhinitis 08/09/2014  . Arthritis 08/09/2014  . A-fib ([place]) 08/09/2014  . Basal cell carcinoma 08/09/2014  . Benign fibroma of prostate 08/09/2014  . Benign neoplasm of colon 08/09/2014  . Narrowing of intervertebral disc space 08/09/2014  . General unsteadiness 08/09/2014  . Hypercholesteremia 08/09/2014  . Hypoxia 08/09/2014  . Malignant melanoma ([place]) 08/09/2014  . Idiopathic peripheral neuropathy ([place]) 08/09/2014  . Arthritis, degenerative 08/09/2014  . Apnea, sleep 08/09/2014  . Temporary cerebral vascular dysfunction 08/09/2014  . B12 deficiency 08/09/2014  . Atrial fibrillation ([place]) 08/09/2014  . Chronic atrial fibrillation ([place]) 04/27/2014  . Disorder of peripheral autonomic nervous system 08/17/2013  . Autonomic neuropathy 08/17/2013    Past Medical History:  Diagnosis Date  . A-fib ([place])   .  Alzheimer disease   . BPH (benign prostatic hyperplasia)   . DDD (degenerative disc disease), cervical   . Elevated PSA   . Eye lesion   . Gout   . HLD (hyperlipidemia)   . Incontinence   . Neuropathy ([place])   . Nocturia   . Osteoarthritis   . Prostate cancer ([place])   . Prostatitis   . Skin cancer of face   . Sleep apnea   . TIA (transient ischemic attack)     Social History   Social History  . Marital status: Married    Spouse name: N/A  . Number of children: N/A  . Years of education: N/A   Occupational History  . Not on file.   Social History Main Topics  . Smoking status: Never Smoker  . Smokeless tobacco: Never Used  . Alcohol use No  . Drug use: No  . Sexual activity: Not on file   Other Topics Concern  . Not on file   Social History Narrative  . No narrative on file    Allergies  Allergen Reactions  . Aricept [Donepezil]     Anxiety   . Cephalexin Itching    Review of Systems  Constitutional: Negative.   HENT: Negative.   Eyes: Negative.   Respiratory: Negative.   Cardiovascular: Negative.   Gastrointestinal: Negative.   Genitourinary: Negative.   Musculoskeletal: Negative.   Skin: Negative.   Neurological: Negative.        Progressive dementia.  Endo/Heme/Allergies: Negative.   Psychiatric/Behavioral: Negative.     Immunization History  Administered Date(s) Administered  . Influenza, High Dose Seasonal PF 12/10/2014  . Pneumococcal Conjugate-13 06/11/2014  . Pneumococcal Polysaccharide-23 11/07/1992  . Td 10/05/1991, 07/04/2014   Objective:  There were no vitals taken for this visit.  Physical Exam  Constitutional: He is oriented to person, place, and time and well-developed, well-nourished, and in no distress.  HENT:  Head: Normocephalic and atraumatic.  Eyes: Conjunctivae and EOM are normal. Pupils are equal, round, and reactive to light.  Neck: Normal range of motion. Neck supple.  Cardiovascular: Normal rate, regular rhythm,  normal heart sounds and intact distal pulses.   Pulmonary/Chest: Effort normal and breath sounds normal.  Neurological: He is alert and oriented to person, place, and time. He has normal reflexes. Gait normal. GCS score is 15.  Skin: Skin is warm and dry.  Psychiatric: Affect and judgment normal.  Patient is sad today as we have discussed the recent passing  of his wife of 42 years.    Lab Results  Component Value Date   WBC 6.5 07/25/2015   HGB 14.2 07/25/2015   HCT 43.1 07/25/2015   PLT 157 07/25/2015   GLUCOSE 94 10/15/2015   CHOL 156 09/14/2013   TRIG 77 09/14/2013   HDL 67 09/14/2013   LDLCALC 74 09/14/2013   TSH 2.68 04/02/2014   INR 2.8 (H) 12/14/2014   HGBA1C 6.4 (A) 09/14/2013    CMP     Component Value Date/Time   NA 137 10/15/2015 1624   K 5.3 (H) 10/15/2015 1624   CL 97 10/15/2015 1624   CO2 23 10/15/2015 1624   GLUCOSE 94 10/15/2015 1624   GLUCOSE 98 07/25/2015 1339   BUN 32 10/15/2015 1624   CREATININE 1.36 (H) 10/15/2015 1624   CALCIUM 10.3 (H) 10/15/2015 1624   AST 21 04/02/2014   ALT 16 04/02/2014   ALKPHOS 84 04/02/2014   GFRNONAA 44 (L) 10/15/2015 1624   GFRAA 51 (L) 10/15/2015 1624    Assessment and Plan :  1. Chronic atrial fibrillation (HCC) Chronic anticoagulation. Safer off of Coumadin on novel agent  2. Memory loss/Alzheimer's disease MMSE on his next visit. Quantity or on gold ordered for placement in assisted living.  3. Grief reaction Pt is grieving the lose of his wife of 59 years. Formed and DNR completed for program through [facility].   Patient was seen and examined by Dr. Miguel Aschoff, and noted scribed by Webb Laws, [place] MD [facility] 11/14/2015 3:41 PM

## 2015-11-18 LAB — QUANTIFERON IN TUBE
QFT TB AG MINUS NIL VALUE: <0 IU/mL
QUANTIFERON MITOGEN VALUE: 9.87 [IU]/mL
QUANTIFERON NIL VALUE: 0.03 [IU]/mL
QUANTIFERON TB AG VALUE: 0.02 [IU]/mL
QUANTIFERON TB GOLD: NEGATIVE

## 2015-11-18 LAB — QUANTIFERON TB GOLD ASSAY (BLOOD)

## 2015-12-22 ENCOUNTER — Encounter: Payer: Self-pay | Admitting: Emergency Medicine

## 2015-12-22 ENCOUNTER — Emergency Department: Payer: Medicare PPO

## 2015-12-22 ENCOUNTER — Emergency Department
Admission: EM | Admit: 2015-12-22 | Discharge: 2015-12-22 | Disposition: A | Discharge status: Home / Self Care | Payer: Medicare PPO | Attending: Emergency Medicine | Admitting: Emergency Medicine

## 2015-12-22 DIAGNOSIS — Y92039 Unspecified place in apartment as the place of occurrence of the external cause: Secondary | ICD-10-CM | POA: Diagnosis not present

## 2015-12-22 DIAGNOSIS — Y939 Activity, unspecified: Secondary | ICD-10-CM | POA: Insufficient documentation

## 2015-12-22 DIAGNOSIS — Z79899 Other long term (current) drug therapy: Secondary | ICD-10-CM | POA: Insufficient documentation

## 2015-12-22 DIAGNOSIS — W19XXXA Unspecified fall, initial encounter: Secondary | ICD-10-CM | POA: Diagnosis not present

## 2015-12-22 DIAGNOSIS — G309 Alzheimer's disease, unspecified: Secondary | ICD-10-CM | POA: Insufficient documentation

## 2015-12-22 DIAGNOSIS — M25511 Pain in right shoulder: Secondary | ICD-10-CM | POA: Diagnosis not present

## 2015-12-22 DIAGNOSIS — Z85828 Personal history of other malignant neoplasm of skin: Secondary | ICD-10-CM | POA: Diagnosis not present

## 2015-12-22 DIAGNOSIS — Y999 Unspecified external cause status: Secondary | ICD-10-CM | POA: Insufficient documentation

## 2015-12-22 DIAGNOSIS — Z8546 Personal history of malignant neoplasm of prostate: Secondary | ICD-10-CM | POA: Diagnosis not present

## 2015-12-22 DIAGNOSIS — R748 Abnormal levels of other serum enzymes: Secondary | ICD-10-CM | POA: Insufficient documentation

## 2015-12-22 LAB — URINALYSIS COMPLETE WITH MICROSCOPIC (ARMC ONLY)
BILIRUBIN URINE: NEGATIVE
Bacteria, UA: NONE SEEN
GLUCOSE, UA: NEGATIVE mg/dL
HGB URINE DIPSTICK: NEGATIVE
KETONES UR: NEGATIVE mg/dL
LEUKOCYTES UA: NEGATIVE
NITRITE: NEGATIVE
Protein, ur: 100 mg/dL — AB
SPECIFIC GRAVITY, URINE: 1.019 (ref 1.005–1.030)
Squamous Epithelial / LPF: NONE SEEN
pH: 5 (ref 5.0–8.0)

## 2015-12-22 LAB — CBC WITH DIFFERENTIAL/PLATELET
BASOS ABS: 0 10*3/uL (ref 0–0.1)
Basophils Relative: 1 %
Eosinophils Absolute: 0.1 10*3/uL (ref 0–0.7)
Eosinophils Relative: 2 %
HEMATOCRIT: 41.2 % (ref 40.0–52.0)
HEMOGLOBIN: 14 g/dL (ref 13.0–18.0)
LYMPHS PCT: 13 %
Lymphs Abs: 1 10*3/uL (ref 1.0–3.6)
MCH: 30.8 pg (ref 26.0–34.0)
MCHC: 33.9 g/dL (ref 32.0–36.0)
MCV: 90.8 fL (ref 80.0–100.0)
MONO ABS: 0.9 10*3/uL (ref 0.2–1.0)
MONOS PCT: 11 %
NEUTROS ABS: 5.7 10*3/uL (ref 1.4–6.5)
Neutrophils Relative %: 73 %
Platelets: 162 10*3/uL (ref 150–440)
RBC: 4.54 MIL/uL (ref 4.40–5.90)
RDW: 14.7 % — AB (ref 11.5–14.5)
WBC: 7.8 10*3/uL (ref 3.8–10.6)

## 2015-12-22 LAB — CK
Total CK: 564 U/L — ABNORMAL HIGH (ref 49–397)
Total CK: 619 U/L — ABNORMAL HIGH (ref 49–397)

## 2015-12-22 LAB — BASIC METABOLIC PANEL
Anion gap: 5 (ref 5–15)
BUN: 36 mg/dL — AB (ref 6–20)
CHLORIDE: 102 mmol/L (ref 101–111)
CO2: 28 mmol/L (ref 22–32)
Calcium: 10 mg/dL (ref 8.9–10.3)
Creatinine, Ser: 1.44 mg/dL — ABNORMAL HIGH (ref 0.61–1.24)
GFR calc Af Amer: 46 mL/min — ABNORMAL LOW (ref >60)
GFR calc non Af Amer: 40 mL/min — ABNORMAL LOW (ref >60)
GLUCOSE: 140 mg/dL — AB (ref 65–99)
POTASSIUM: 3.8 mmol/L (ref 3.5–5.1)
Sodium: 135 mmol/L (ref 135–145)

## 2015-12-22 LAB — TROPONIN I
Troponin I: 0.07 ng/mL (ref <0.03)
Troponin I: 0.05 ng/mL (ref <0.03)

## 2015-12-22 MED ORDER — SODIUM CHLORIDE 0.9 % IV BOLUS (SEPSIS)
1000.0000 mL | Freq: Once | INTRAVENOUS | Status: AC
  Administered 2015-12-22: 1000 mL via INTRAVENOUS

## 2015-12-22 MED ORDER — SODIUM CHLORIDE 0.9 % IV SOLN
Freq: Once | INTRAVENOUS | Status: AC
  Administered 2015-12-22: 21:00:00 via INTRAVENOUS

## 2015-12-22 NOTE — Discharge Instructions (Signed)
Make sure to increase oral hydration as we discussed. Follow up with [name] tomorrow morning for recheck of patient's CK and creatinine. If you are unable to see his doctor tomorrow please bring patient back to the emergency room so we can recheck those levels. Also return to the emergency room if he has severe headache, confusion, chest pain, back pain, shortness of breath, abdominal pain, or any other symptoms that are concerning to you.

## 2015-12-22 NOTE — ED Notes (Addendum)
Patient's family out of room to say patient needed to go to the bathroom. Patient assisted to commode. Patient is very unsteady on his feet and requires two person assist. Yellow nonskid socks placed as well as fall-identifying bracelet and yellow magnet placed on doorway. Reminded patient to use the call bell should he need to get out of bed.

## 2015-12-22 NOTE — ED Triage Notes (Signed)
Found on floor - pt has history of alzheimers and per family has had a marked decrease in coherence over the last 2 months.

## 2015-12-22 NOTE — ED Notes (Signed)
Urine sent to lab 

## 2015-12-22 NOTE — ED Provider Notes (Signed)
[facility] Emergency Department Provider Note  ____________________________________________  Time seen: Approximately 5:35 PM  I have reviewed the triage vital signs and the nursing notes.   HISTORY  Chief Complaint Fall  Level 5 caveat:  Portions of the history and physical were unable to be obtained due to dementia   HPI Kerry [name] is a 82 y.o. male history of paroxysmal atrial fibrillation on Xarelto, Alzheimer's disease, prostate cancer currently not undergoing any treatment, TIA who presents status post fall. Patient is accompanied by his 2 daughters who provide most of the history. Patient was last seen at his assisted living apartment yesterday at 8 PM and by home health nurse. This morning his daughters were not able to reach patient's so they called security. Patient was found on the floor on the entrance of his apartment surrounded by garbage in a garbage can. His walker and glasses were not with him. Patient was fully dressed and different clothing that he work yesterday which made the daughters think the patient probably fell this morning. He was found at 9 AM this morning. Patient is confused and thinks that he was in a closet overnight surrounded by garbage. He does not remember falling. His only complaint at this time is pain in his right shoulder.  Past Medical History:  Diagnosis Date  . A-fib ([place])   . Alzheimer disease   . BPH (benign prostatic hyperplasia)   . DDD (degenerative disc disease), cervical   . Elevated PSA   . Eye lesion   . Gout   . HLD (hyperlipidemia)   . Incontinence   . Neuropathy ([place])   . Nocturia   . Osteoarthritis   . Prostate cancer ([place])   . Prostatitis   . Skin cancer of face   . Sleep apnea   . TIA (transient ischemic attack)     Patient Active Problem List   Diagnosis Date Noted  . Prostate cancer ([place]) 04/15/2015  . Prostate cancer metastatic to bone ([place]) 12/10/2014  . Memory loss  12/10/2014  . Adenocarcinoma of prostate ([place]) 08/09/2014  . Allergic rhinitis 08/09/2014  . Arthritis 08/09/2014  . A-fib ([place]) 08/09/2014  . Basal cell carcinoma 08/09/2014  . Benign fibroma of prostate 08/09/2014  . Benign neoplasm of colon 08/09/2014  . Narrowing of intervertebral disc space 08/09/2014  . General unsteadiness 08/09/2014  . Hypercholesteremia 08/09/2014  . Hypoxia 08/09/2014  . Malignant melanoma ([place]) 08/09/2014  . Idiopathic peripheral neuropathy ([place]) 08/09/2014  . Arthritis, degenerative 08/09/2014  . Apnea, sleep 08/09/2014  . Temporary cerebral vascular dysfunction 08/09/2014  . B12 deficiency 08/09/2014  . Atrial fibrillation ([place]) 08/09/2014  . Chronic atrial fibrillation ([place]) 04/27/2014  . Disorder of peripheral autonomic nervous system 08/17/2013  . Autonomic neuropathy 08/17/2013    Past Surgical History:  Procedure Laterality Date  . HERNIA REPAIR    . MOHS SURGERY  2015   skin cancer surgery on the face and neck  . TRANSURETHRAL RESECTION OF PROSTATE  1993   Dr. Eliberto Ivory    Prior to Admission medications   Medication Sig Start Date End Date Taking? Authorizing Provider  calcium carbonate (OS-CAL) 600 MG TABS tablet Take by mouth.    Historical Provider, MD  cyanocobalamin 1000 MCG tablet Take by mouth. 10/03/12   Historical Provider, MD  folic acid (FOLVITE) Q000111Q MCG tablet Take by mouth.    Historical Provider, MD  Multiple Vitamins-Minerals (PRESERVISION AREDS PO) Take by mouth.    Historical Provider, MD  Rivaroxaban (XARELTO) 15 MG TABS tablet Take 1 tablet (15 mg total) by mouth daily with supper. 09/27/15   Richard Maceo Pro., MD    Allergies Aricept [donepezil] and Cephalexin  Family History  Problem Relation Age of Onset  . Pancreatic cancer Mother   . Throat cancer Father   . CAD Father   . Heart attack Father   . Heart disease Father   . Diabetes Sister   . Prostate cancer      half uncle  . Kidney disease Neg Hx      Social History Social History  Substance Use Topics  . Smoking status: Never Smoker  . Smokeless tobacco: Never Used  . Alcohol use No    Review of Systems  Constitutional: Negative for fever. Eyes: Negative for visual changes. ENT: Negative for sore throat. Cardiovascular: Negative for chest pain. Respiratory: Negative for shortness of breath. Gastrointestinal: Negative for abdominal pain, vomiting or diarrhea. Genitourinary: Negative for dysuria. Musculoskeletal: Negative for back pain. + L shoulder pain Skin: Negative for rash. Neurological: Negative for headaches, weakness or numbness.  ____________________________________________   PHYSICAL EXAM:  VITAL SIGNS: ED Triage Vitals  Enc Vitals Group     BP 12/22/15 1357 (!) 118/52     Pulse Rate 12/22/15 1357 65     Resp 12/22/15 1357 18     Temp 12/22/15 1357 97.5 F (36.4 C)     Temp Source 12/22/15 1357 Oral     SpO2 12/22/15 1357 97 %     Weight 12/22/15 1358 146 lb (66.2 kg)     Height 12/22/15 1358 5\' 7"  (1.702 m)     Head Circumference --      Peak Flow --      Pain Score --      Pain Loc --      Pain Edu? --      Excl. in [place]? --     Constitutional: Alert and oriented. No acute distress. Does not appear intoxicated. HEENT Head: Normocephalic and atraumatic. Face: No facial bony tenderness. Stable midface Ears: No hemotympanum bilaterally. No Battle sign Eyes: No eye injury. PERRL. No raccoon eyes Nose: Nontender. No epistaxis. No rhinorrhea Mouth/Throat: Mucous membranes are moist. No oropharyngeal blood. No dental injury. Airway patent without stridor. Normal voice. Neck: no C-collar in place. No midline c-spine tenderness.  Cardiovascular: Normal rate, regular rhythm. Normal and symmetric distal pulses are present in all extremities. Pulmonary/Chest: Chest wall is stable and nontender to palpation/compression. Normal respiratory effort. Breath sounds are normal. No crepitus.  Abdominal: Soft,  nontender, non distended. Musculoskeletal: Bruising over the R scapula with no deformities and no pain. Nontender with normal full range of motion in all extremities. No deformities. No thoracic or lumbar midline spinal tenderness. Pelvis is stable. Skin: Skin is warm, dry and intact. No abrasions or contutions. Psychiatric: Speech and behavior are appropriate. Neurological: Normal speech and language. Moves all extremities to command. No gross focal neurologic deficits are appreciated.  Glascow Coma Score: 4 - Opens eyes on own 6 - Follows simple motor commands 5 - Alert and oriented GCS: 15   ____________________________________________   LABS (all labs ordered are listed, but only abnormal results are displayed)  Labs Reviewed  URINALYSIS COMPLETEWITH MICROSCOPIC ([facility] ONLY) - Abnormal; Notable for the following:       Result Value   Color, Urine YELLOW (*)    APPearance CLEAR (*)    Protein, ur 100 (*)    All other components within  normal limits  CK - Abnormal; Notable for the following:    Total CK 564 (*)    All other components within normal limits  BASIC METABOLIC PANEL - Abnormal; Notable for the following:    Glucose, Bld 140 (*)    BUN 36 (*)    Creatinine, Ser 1.44 (*)    GFR calc non Af Amer 40 (*)    GFR calc Af Amer 46 (*)    All other components within normal limits  TROPONIN I - Abnormal; Notable for the following:    Troponin I 0.07 (*)    All other components within normal limits  CBC WITH DIFFERENTIAL/PLATELET - Abnormal; Notable for the following:    RDW 14.7 (*)    All other components within normal limits  CK - Abnormal; Notable for the following:    Total CK 619 (*)    All other components within normal limits  TROPONIN I - Abnormal; Notable for the following:    Troponin I 0.05 (*)    All other components within normal limits   ____________________________________________  EKG  ED ECG REPORT I, Rudene Re, the attending physician,  personally viewed and interpreted this ECG.  Atrial fibrillation, rate of 66, normal QTC interval, left axis deviation, no ST elevations or depressions. ____________________________________________  RADIOLOGY  Head and c-spine CT: 1. No evidence of traumatic intracranial injury or fracture. 2. No evidence of fracture or subluxation along the cervical spine. 3. Mild to moderate cortical volume loss and scattered small vessel ischemic microangiopathy. 4. Prominent calcification at the carotid bifurcations bilaterally. Carotid ultrasound could be considered for further evaluation, when and as deemed clinically appropriate.  [id number] atherosclerosis. Scarring left base. No edema or consolidation.  XR R shoulder: Osteoarthritic change. Bony overgrowth acromioclavicular joint. No acute fracture or dislocation.  ____________________________________________   PROCEDURES  Procedure(s) performed: None Procedures Critical Care performed:  None ____________________________________________   INITIAL IMPRESSION / ASSESSMENT AND PLAN / ED COURSE  82 y.o. male history of paroxysmal atrial fibrillation on Xarelto, Alzheimer's disease, prostate cancer currently not undergoing any treatment, TIA who presents for evaluation status post unwitness fall. Patient was found down in his apartment for an unknown period of time. Patient's only complaint at this time is pain in his right shoulder and patient does have bruising around his right scapula however has full painless range of motion and no tenderness to palpation. He is neurologically intact. EKG showing A. fib with well-controlled rate and no ischemia. We'll check basic labs including total CK, urinalysis, CT head, CT cervical spine, x-ray of the shoulder/scapula, and chest x-ray.  Clinical Course  Comment By Time  Patient with initial total CK elevated at 564 and elevated troponin at 0.07. EKG with no ischemia. I do believe troponin is levated  in the setting of elevated total CK from muscle injury and not due to cardiac injury. Normal kidney function. Patient received 1L NS and was started on maintenance fluid in the ED. Repeat CK at 619, patient making urine. Troponin trending down. I had a long discussion with patient's daughter about admitting patient to the hospital for IV hydration versus close follow-up with patient's primary care doctor in the morning to repeat the CK. They have decided to take patient home and they will encourage by mouth hydration and call patient's PCP at 8 AM to have his CK level rechecked. There is no evidence of rhabdo at this time. I have encouraged them to return to the emergency room if they  are unable to get these levels checked as an outpatient yesterday so we can repeat them here in the ED or if any new symptoms develop in the meantime. I also emailed patient's PCP to ensure close follow up.  Rudene Re, MD 09/24 2253    Pertinent labs & imaging results that were available during my care of the patient were reviewed by me and considered in my medical decision making (see chart for details).    ____________________________________________   FINAL CLINICAL IMPRESSION(S) / ED DIAGNOSES  Final diagnoses:  Fall, initial encounter  Right shoulder pain  Elevated CK      NEW MEDICATIONS STARTED DURING THIS VISIT:  New Prescriptions   No medications on file     Note:  This document was prepared using Dragon voice recognition software and may include unintentional dictation errors.    Rudene Re, MD 12/22/15 [phone]

## 2015-12-23 ENCOUNTER — Other Ambulatory Visit: Payer: Self-pay

## 2015-12-23 ENCOUNTER — Encounter: Payer: Self-pay | Admitting: Emergency Medicine

## 2015-12-23 ENCOUNTER — Telehealth: Payer: Self-pay | Admitting: Family Medicine

## 2015-12-23 ENCOUNTER — Emergency Department
Admission: EM | Admit: 2015-12-23 | Discharge: 2015-12-23 | Disposition: A | Discharge status: Home / Self Care | Payer: Medicare PPO | Attending: Emergency Medicine | Admitting: Emergency Medicine

## 2015-12-23 DIAGNOSIS — R748 Abnormal levels of other serum enzymes: Secondary | ICD-10-CM

## 2015-12-23 DIAGNOSIS — Z85828 Personal history of other malignant neoplasm of skin: Secondary | ICD-10-CM | POA: Diagnosis not present

## 2015-12-23 DIAGNOSIS — G309 Alzheimer's disease, unspecified: Secondary | ICD-10-CM | POA: Diagnosis not present

## 2015-12-23 DIAGNOSIS — Z8673 Personal history of transient ischemic attack (TIA), and cerebral infarction without residual deficits: Secondary | ICD-10-CM | POA: Diagnosis not present

## 2015-12-23 DIAGNOSIS — R7989 Other specified abnormal findings of blood chemistry: Secondary | ICD-10-CM | POA: Diagnosis present

## 2015-12-23 DIAGNOSIS — R944 Abnormal results of kidney function studies: Secondary | ICD-10-CM | POA: Insufficient documentation

## 2015-12-23 DIAGNOSIS — Z8546 Personal history of malignant neoplasm of prostate: Secondary | ICD-10-CM | POA: Diagnosis not present

## 2015-12-23 LAB — COMPREHENSIVE METABOLIC PANEL
ALT: 22 U/L (ref 17–63)
ANION GAP: 6 (ref 5–15)
AST: 53 U/L — AB (ref 15–41)
Albumin: 3.6 g/dL (ref 3.5–5.0)
Alkaline Phosphatase: 68 U/L (ref 38–126)
BUN: 27 mg/dL — AB (ref 6–20)
CHLORIDE: 99 mmol/L — AB (ref 101–111)
CO2: 26 mmol/L (ref 22–32)
Calcium: 9.2 mg/dL (ref 8.9–10.3)
Creatinine, Ser: 1.31 mg/dL — ABNORMAL HIGH (ref 0.61–1.24)
GFR, EST AFRICAN AMERICAN: 52 mL/min — AB (ref >60)
GFR, EST NON AFRICAN AMERICAN: 45 mL/min — AB (ref >60)
Glucose, Bld: 129 mg/dL — ABNORMAL HIGH (ref 65–99)
POTASSIUM: 3.8 mmol/L (ref 3.5–5.1)
Sodium: 131 mmol/L — ABNORMAL LOW (ref 135–145)
Total Bilirubin: 0.6 mg/dL (ref 0.3–1.2)
Total Protein: 5.6 g/dL — ABNORMAL LOW (ref 6.5–8.1)

## 2015-12-23 LAB — CBC WITH DIFFERENTIAL/PLATELET
BASOS ABS: 0 10*3/uL (ref 0–0.1)
BASOS PCT: 1 %
EOS PCT: 4 %
Eosinophils Absolute: 0.2 10*3/uL (ref 0–0.7)
HCT: 36.8 % — ABNORMAL LOW (ref 40.0–52.0)
Hemoglobin: 12.8 g/dL — ABNORMAL LOW (ref 13.0–18.0)
Lymphocytes Relative: 19 %
Lymphs Abs: 1 10*3/uL (ref 1.0–3.6)
MCH: 31.3 pg (ref 26.0–34.0)
MCHC: 34.9 g/dL (ref 32.0–36.0)
MCV: 89.8 fL (ref 80.0–100.0)
MONO ABS: 0.6 10*3/uL (ref 0.2–1.0)
MONOS PCT: 12 %
Neutro Abs: 3.5 10*3/uL (ref 1.4–6.5)
Neutrophils Relative %: 64 %
Platelets: 148 10*3/uL — ABNORMAL LOW (ref 150–440)
RBC: 4.1 MIL/uL — ABNORMAL LOW (ref 4.40–5.90)
RDW: 14.5 % (ref 11.5–14.5)
WBC: 5.4 10*3/uL (ref 3.8–10.6)

## 2015-12-23 LAB — CK: CK TOTAL: 745 U/L — AB (ref 49–397)

## 2015-12-23 LAB — URINALYSIS COMPLETE WITH MICROSCOPIC (ARMC ONLY)
Bacteria, UA: NONE SEEN
Bilirubin Urine: NEGATIVE
Glucose, UA: NEGATIVE mg/dL
Hgb urine dipstick: NEGATIVE
KETONES UR: NEGATIVE mg/dL
LEUKOCYTES UA: NEGATIVE
NITRITE: NEGATIVE
PH: 5 (ref 5.0–8.0)
PROTEIN: 30 mg/dL — AB
SPECIFIC GRAVITY, URINE: 1.008 (ref 1.005–1.030)
Squamous Epithelial / LPF: NONE SEEN
WBC, UA: NONE SEEN WBC/hpf (ref 0–5)

## 2015-12-23 MED ORDER — SODIUM CHLORIDE 0.9 % IV BOLUS (SEPSIS)
1000.0000 mL | Freq: Once | INTRAVENOUS | Status: AC
  Administered 2015-12-23: 1000 mL via INTRAVENOUS

## 2015-12-23 NOTE — ED Provider Notes (Addendum)
[facility] Emergency Department Provider Note  ____________________________________________   I have reviewed the triage vital signs and the nursing notes.   HISTORY  Chief Complaint Labs Only    HPI Kerry [name] is a 82 y.o. male who fell over the weekend, was seen here on the 24th and had a mildly elevated total CK. He does have extensive bruising to his back which is getting better. He states he doesn't have any other complaints or symptoms. Patient and family are here because they were unable to get outpatient total CK checked.     Past Medical History:  Diagnosis Date  . A-fib ([place])   . Alzheimer disease   . BPH (benign prostatic hyperplasia)   . DDD (degenerative disc disease), cervical   . Elevated PSA   . Eye lesion   . Gout   . HLD (hyperlipidemia)   . Incontinence   . Neuropathy ([place])   . Nocturia   . Osteoarthritis   . Prostate cancer ([place])   . Prostatitis   . Skin cancer of face   . Sleep apnea   . TIA (transient ischemic attack)     Patient Active Problem List   Diagnosis Date Noted  . Prostate cancer ([place]) 04/15/2015  . Prostate cancer metastatic to bone ([place]) 12/10/2014  . Memory loss 12/10/2014  . Adenocarcinoma of prostate ([place]) 08/09/2014  . Allergic rhinitis 08/09/2014  . Arthritis 08/09/2014  . A-fib ([place]) 08/09/2014  . Basal cell carcinoma 08/09/2014  . Benign fibroma of prostate 08/09/2014  . Benign neoplasm of colon 08/09/2014  . Narrowing of intervertebral disc space 08/09/2014  . General unsteadiness 08/09/2014  . Hypercholesteremia 08/09/2014  . Hypoxia 08/09/2014  . Malignant melanoma ([place]) 08/09/2014  . Idiopathic peripheral neuropathy ([place]) 08/09/2014  . Arthritis, degenerative 08/09/2014  . Apnea, sleep 08/09/2014  . Temporary cerebral vascular dysfunction 08/09/2014  . B12 deficiency 08/09/2014  . Atrial fibrillation ([place]) 08/09/2014  . Chronic atrial fibrillation ([place]) 04/27/2014  .  Disorder of peripheral autonomic nervous system 08/17/2013  . Autonomic neuropathy 08/17/2013    Past Surgical History:  Procedure Laterality Date  . HERNIA REPAIR    . MOHS SURGERY  2015   skin cancer surgery on the face and neck  . TRANSURETHRAL RESECTION OF PROSTATE  1993   Dr. Eliberto Ivory    Prior to Admission medications   Medication Sig Start Date End Date Taking? Authorizing Provider  calcium carbonate (OS-CAL) 600 MG TABS tablet Take by mouth.    Historical Provider, MD  cyanocobalamin 1000 MCG tablet Take by mouth. 10/03/12   Historical Provider, MD  folic acid (FOLVITE) Q000111Q MCG tablet Take by mouth.    Historical Provider, MD  Multiple Vitamins-Minerals (PRESERVISION AREDS PO) Take by mouth.    Historical Provider, MD  Rivaroxaban (XARELTO) 15 MG TABS tablet Take 1 tablet (15 mg total) by mouth daily with supper. 09/27/15   Richard Maceo Pro., MD    Allergies Aricept [donepezil] and Cephalexin  Family History  Problem Relation Age of Onset  . Pancreatic cancer Mother   . Throat cancer Father   . CAD Father   . Heart attack Father   . Heart disease Father   . Diabetes Sister   . Prostate cancer      half uncle  . Kidney disease Neg Hx     Social History Social History  Substance Use Topics  . Smoking status: Never Smoker  . Smokeless tobacco: Never Used  . Alcohol  use No    Review of Systems Constitutional: No fever/chills Eyes: No visual changes. ENT: No sore throat. No stiff neck no neck pain Cardiovascular: Denies chest pain. Respiratory: Denies shortness of breath. Gastrointestinal:   no vomiting.  No diarrhea.  No constipation. Genitourinary: Negative for dysuria. Musculoskeletal: Negative lower extremity swelling Skin: Negative for rash. Neurological: Negative for severe headaches, focal weakness or numbness. 10-point ROS otherwise negative.  ____________________________________________   PHYSICAL EXAM:  VITAL SIGNS: ED Triage Vitals [12/23/15  1504]  Enc Vitals Group     BP      Pulse      Resp      Temp      Temp src      SpO2      Weight 149 lb (67.6 kg)     Height 6' (1.829 m)     Head Circumference      Peak Flow      Pain Score 0     Pain Loc      Pain Edu?      Excl. in [place]?     Constitutional: Alert and oriented to name and place unsure of the exact year. Well appearing and in no acute distress. Eyes: Conjunctivae are normal. PERRL. EOMI. Head: Atraumatic. Nose: No congestion/rhinnorhea. Mouth/Throat: Mucous membranes are moist.  Oropharynx non-erythematous. Neck: No stridor.   Nontender with no meningismus Cardiovascular: Normal rate, regular rhythm. Grossly normal heart sounds.  Good peripheral circulation. Respiratory: Normal respiratory effort.  No retractions. Lungs CTAB. Abdominal: Soft and nontender. No distention. No guarding no rebound Back:  Is bruising but no evidence of compartment syndrome.  there is no midline tenderness there are no lesions noted. there is no CVA tenderness Musculoskeletal: No lower extremity tenderness, no upper extremity tenderness. No joint effusions, no DVT signs strong distal pulses no edema Neurologic:  Normal speech and language. No gross focal neurologic deficits are appreciated.  Skin:  Skin is warm, dry and intact. No rash noted. Psychiatric: Mood and affect are normal. Speech and behavior are normal.  ____________________________________________   LABS (all labs ordered are listed, but only abnormal results are displayed)  Labs Reviewed  COMPREHENSIVE METABOLIC PANEL - Abnormal; Notable for the following:       Result Value   Sodium 131 (*)    Chloride 99 (*)    Glucose, Bld 129 (*)    BUN 27 (*)    Creatinine, Ser 1.31 (*)    Total Protein 5.6 (*)    AST 53 (*)    GFR calc non Af Amer 45 (*)    GFR calc Af Amer 52 (*)    All other components within normal limits  CK - Abnormal; Notable for the following:    Total CK 745 (*)    All other components within  normal limits  URINALYSIS COMPLETEWITH MICROSCOPIC ([facility] ONLY)  CBC WITH DIFFERENTIAL/PLATELET   ____________________________________________  EKG  I personally interpreted any EKGs ordered by me or triage  ____________________________________________  RADIOLOGY  I reviewed any imaging ordered by me or triage that were performed during my shift and, if possible, patient and/or family made aware of any abnormal findings. ____________________________________________   PROCEDURES  Procedure(s) performed: None  Procedures  Critical Care performed: None  ____________________________________________   INITIAL IMPRESSION / ASSESSMENT AND PLAN / ED COURSE  Pertinent labs & imaging results that were available during my care of the patient were reviewed by me and considered in my medical decision making (see chart  for details).  Patient presents today with no complaints. He will be receiving 24 hours care on discharge. However, he was sent in to have his total CK checked. It is still moderately elevated. Not dangerously so. Kidney function is actually better than was 2 days ago and is at or below his baseline. He has no complaints. I will give him IV fluid here as he does not have a known history of CHF. There is no evidence of compartment syndrome or acute renal injury from this. Patient would've course and his age according to family and I agree do better at home/nursing home if possible. We would prefer not to admit him for this. I have paged his primary care doctor and we'll try again. ----------------------------------------- 4:42 PM on 12/23/2015 -----------------------------------------  Patient and family all feel the patient which had better go home and so does his primary care doctor. I think this is a reasonable discussion and a reasonable plan. We will give him IV fluid here after discussing with [name] who agrees with this plan of my and we will discharge him home that  we'll recheck C cane a day or 2. Patient has 24-hour care. I talked to [name] about the advisability of continuing the patient on Xarelto given his history of falls he states that the patient has been insisting on taking it at of stroke fear for the last 5 years and they will continue to discuss with him whether they wish to continue him on that medication. Patient has therefore very good follow-up is in the excellent hands of his daughters who are quite conscientious and has for for care at home we will discharge him after IV fluids.  Clinical Course   ____________________________________________   FINAL CLINICAL IMPRESSION(S) / ED DIAGNOSES  Final diagnoses:  None      This chart was dictated using voice recognition software.  Despite best efforts to proofread,  errors can occur which can change meaning.      Schuyler Amor, MD 12/23/15 [place], MD 12/23/15 [phone]

## 2015-12-23 NOTE — Telephone Encounter (Signed)
Please review-aa 

## 2015-12-23 NOTE — ED Provider Notes (Signed)
I spoke with the patient's daughter today and unfortunately they have not been able to see his PCP to have repeat creatinine and CK done as instructed. I encouraged them to take patient back to the ED so these labs can be re-checked. Daughter will bring patient back at this time. I will call the ED to let them know that patient is expected.   Rudene Re, MD 12/23/15 1420

## 2015-12-23 NOTE — Telephone Encounter (Signed)
Kerry [name], daughter, came by the office and this has been taking cared of-aa

## 2015-12-23 NOTE — Telephone Encounter (Signed)
Daughter is requesting a FL2 form completed and faxed to [facility] due to pt being moved to skilled nursing.    Pt daughter, Pamala Hurry is also requesting a call back from Dr Rosanna Randy.  [id number]

## 2015-12-23 NOTE — Telephone Encounter (Signed)
Daughter called saying dad was taken to ER yesterday because of a fall.  Daughter says ER is sending a request for [name] to order a CK level for protein in blood.  She only has someone there to help her until 10:00 am this am.  She would like for [place] to asap send the order to [facility].  Her call back is [phone]  Thanks Con Memos

## 2015-12-23 NOTE — ED Notes (Signed)
Pt receiving IV fluid

## 2015-12-23 NOTE — Telephone Encounter (Signed)
2 messages below-aa

## 2015-12-23 NOTE — ED Triage Notes (Signed)
Pt fell yesterday and was seen here.  Seen by Dr. Lois Huxley yesterday and told to return today to check CK and creatinine level

## 2015-12-23 NOTE — Discharge Instructions (Signed)
Follow-up closely with your doctor in the next day or 2 to have your total CK checked again. If you have any new or worrisome symptoms including increased pain, difficulty urinating dark stool you  feel dehydrated or you feel worse in any way occluding significant muscle aches return to the emergency room

## 2015-12-26 DIAGNOSIS — G9009 Other idiopathic peripheral autonomic neuropathy: Secondary | ICD-10-CM | POA: Diagnosis not present

## 2015-12-26 DIAGNOSIS — Z8546 Personal history of malignant neoplasm of prostate: Secondary | ICD-10-CM | POA: Diagnosis not present

## 2015-12-26 DIAGNOSIS — C7951 Secondary malignant neoplasm of bone: Secondary | ICD-10-CM

## 2015-12-26 DIAGNOSIS — I482 Chronic atrial fibrillation: Secondary | ICD-10-CM | POA: Diagnosis not present

## 2015-12-26 DIAGNOSIS — G301 Alzheimer's disease with late onset: Secondary | ICD-10-CM | POA: Diagnosis not present

## 2016-01-06 DIAGNOSIS — Y92120 Kitchen in nursing home as the place of occurrence of the external cause: Secondary | ICD-10-CM | POA: Diagnosis not present

## 2016-01-06 DIAGNOSIS — B351 Tinea unguium: Secondary | ICD-10-CM | POA: Diagnosis not present

## 2016-01-06 DIAGNOSIS — M25579 Pain in unspecified ankle and joints of unspecified foot: Secondary | ICD-10-CM | POA: Diagnosis not present

## 2016-01-13 ENCOUNTER — Ambulatory Visit: Payer: Medicare PPO | Admitting: Family Medicine

## 2016-01-20 DIAGNOSIS — E441 Mild protein-calorie malnutrition: Secondary | ICD-10-CM | POA: Diagnosis not present

## 2016-01-20 DIAGNOSIS — I481 Persistent atrial fibrillation: Secondary | ICD-10-CM | POA: Diagnosis not present

## 2016-01-20 DIAGNOSIS — G9009 Other idiopathic peripheral autonomic neuropathy: Secondary | ICD-10-CM

## 2016-01-20 DIAGNOSIS — G301 Alzheimer's disease with late onset: Secondary | ICD-10-CM | POA: Diagnosis not present

## 2016-01-20 DIAGNOSIS — C61 Malignant neoplasm of prostate: Secondary | ICD-10-CM | POA: Diagnosis not present

## 2016-02-12 DIAGNOSIS — G309 Alzheimer's disease, unspecified: Secondary | ICD-10-CM | POA: Diagnosis not present

## 2016-02-12 DIAGNOSIS — G629 Polyneuropathy, unspecified: Secondary | ICD-10-CM

## 2016-02-12 DIAGNOSIS — Z8546 Personal history of malignant neoplasm of prostate: Secondary | ICD-10-CM | POA: Diagnosis not present

## 2016-02-12 DIAGNOSIS — I4891 Unspecified atrial fibrillation: Secondary | ICD-10-CM | POA: Diagnosis not present

## 2016-02-12 DIAGNOSIS — E43 Unspecified severe protein-calorie malnutrition: Secondary | ICD-10-CM | POA: Diagnosis not present

## 2016-02-16 ENCOUNTER — Emergency Department
Admission: EM | Admit: 2016-02-16 | Discharge: 2016-02-16 | Disposition: A | Discharge status: Home / Self Care | Payer: Medicare PPO | Attending: Emergency Medicine | Admitting: Emergency Medicine

## 2016-02-16 DIAGNOSIS — Y999 Unspecified external cause status: Secondary | ICD-10-CM | POA: Insufficient documentation

## 2016-02-16 DIAGNOSIS — S0990XA Unspecified injury of head, initial encounter: Secondary | ICD-10-CM

## 2016-02-16 DIAGNOSIS — S61217A Laceration without foreign body of left little finger without damage to nail, initial encounter: Secondary | ICD-10-CM | POA: Diagnosis not present

## 2016-02-16 DIAGNOSIS — S0001XA Abrasion of scalp, initial encounter: Secondary | ICD-10-CM | POA: Diagnosis not present

## 2016-02-16 DIAGNOSIS — Y939 Activity, unspecified: Secondary | ICD-10-CM | POA: Insufficient documentation

## 2016-02-16 DIAGNOSIS — W19XXXA Unspecified fall, initial encounter: Secondary | ICD-10-CM | POA: Insufficient documentation

## 2016-02-16 DIAGNOSIS — T148XXA Other injury of unspecified body region, initial encounter: Secondary | ICD-10-CM

## 2016-02-16 DIAGNOSIS — Z79899 Other long term (current) drug therapy: Secondary | ICD-10-CM | POA: Diagnosis not present

## 2016-02-16 DIAGNOSIS — Y929 Unspecified place or not applicable: Secondary | ICD-10-CM | POA: Diagnosis not present

## 2016-02-16 DIAGNOSIS — Z8546 Personal history of malignant neoplasm of prostate: Secondary | ICD-10-CM | POA: Diagnosis not present

## 2016-02-16 DIAGNOSIS — G309 Alzheimer's disease, unspecified: Secondary | ICD-10-CM | POA: Diagnosis not present

## 2016-02-16 NOTE — ED Provider Notes (Signed)
[facility] Emergency Department Provider Note  ____________________________________________   First MD Initiated Contact with Patient 02/16/16 0125     (approximate)  I have reviewed the triage vital signs and the nursing notes.   HISTORY  Chief Complaint Fall    HPI Kerry [name] is a 82 y.o. male who lives at [facility] and has a history of frequent falls in addition to taking Xarelto and presents by EMS for evaluation of a fall.  It was unwitnessed by staff but he very clearly describes and remembers the incident.  He was up and ambulating and tried to turn around too fast and lost his balance.  He has a small abrasion on the left side of his head and does state that he bumped his head but states that he does that "all the time".  He has no headache at this time and denies neck pain.  He says that he has some pain in his hand when he moves it and he has a very large skin tear on the back of his left hand as well as a smaller skin tear to his left finger.  He is able to move the hand and finger without difficulty but says that it makes the skin hurt.  He denies chest pain, shortness of breath, fever/chills, abdominal pain, dysuria.  He is alert and oriented and appropriate in spite of his advanced age.  He came with documentation from [facility] including his DNR/DNI form.   Past Medical History:  Diagnosis Date  . A-fib ([place])   . Alzheimer disease   . BPH (benign prostatic hyperplasia)   . DDD (degenerative disc disease), cervical   . Elevated PSA   . Eye lesion   . Gout   . HLD (hyperlipidemia)   . Incontinence   . Neuropathy ([place])   . Nocturia   . Osteoarthritis   . Prostate cancer ([place])   . Prostatitis   . Skin cancer of face   . Sleep apnea   . TIA (transient ischemic attack)     Patient Active Problem List   Diagnosis Date Noted  . Prostate cancer ([place]) 04/15/2015  . Prostate cancer metastatic to bone ([place]) 12/10/2014  . Memory  loss 12/10/2014  . Adenocarcinoma of prostate ([place]) 08/09/2014  . Allergic rhinitis 08/09/2014  . Arthritis 08/09/2014  . A-fib ([place]) 08/09/2014  . Basal cell carcinoma 08/09/2014  . Benign fibroma of prostate 08/09/2014  . Benign neoplasm of colon 08/09/2014  . Narrowing of intervertebral disc space 08/09/2014  . General unsteadiness 08/09/2014  . Hypercholesteremia 08/09/2014  . Hypoxia 08/09/2014  . Malignant melanoma ([place]) 08/09/2014  . Idiopathic peripheral neuropathy 08/09/2014  . Arthritis, degenerative 08/09/2014  . Apnea, sleep 08/09/2014  . Temporary cerebral vascular dysfunction 08/09/2014  . B12 deficiency 08/09/2014  . Atrial fibrillation ([place]) 08/09/2014  . Chronic atrial fibrillation ([place]) 04/27/2014  . Disorder of peripheral autonomic nervous system 08/17/2013  . Autonomic neuropathy 08/17/2013    Past Surgical History:  Procedure Laterality Date  . HERNIA REPAIR    . MOHS SURGERY  2015   skin cancer surgery on the face and neck  . TRANSURETHRAL RESECTION OF PROSTATE  1993   Dr. Eliberto Ivory    Prior to Admission medications   Medication Sig Start Date End Date Taking? Authorizing Provider  Cholecalciferol (VITAMIN D3) 50000 units CAPS Take 1 capsule by mouth every 30 (thirty) days.   Yes Historical Provider, MD  Multiple Vitamin (THEREMS) TABS Take  1 tablet by mouth daily.   Yes Historical Provider, MD  Multiple Vitamins-Minerals (PRESERVISION AREDS PO) Take 1 capsule by mouth daily.    Yes Historical Provider, MD  Polyethyl Glycol-Propyl Glycol (SYSTANE) 0.4-0.3 % SOLN Apply 1-2 drops to eye 4 (four) times daily as needed (dry eyes).   Yes Historical Provider, MD  Rivaroxaban (XARELTO) 15 MG TABS tablet Take 1 tablet (15 mg total) by mouth daily with supper. 09/27/15  Yes Richard Maceo Pro., MD    Allergies Cephalexin and Aricept [donepezil]  Family History  Problem Relation Age of Onset  . Pancreatic cancer Mother   . Throat cancer Father   . CAD Father     . Heart attack Father   . Heart disease Father   . Diabetes Sister   . Prostate cancer      half uncle  . Kidney disease Neg Hx     Social History Social History  Substance Use Topics  . Smoking status: Never Smoker  . Smokeless tobacco: Never Used  . Alcohol use No    Review of Systems Constitutional: No fever/chills Eyes: No visual changes. ENT: No sore throat. Cardiovascular: Denies chest pain. Respiratory: Denies shortness of breath. Gastrointestinal: No abdominal pain.  No nausea, no vomiting.  No diarrhea.  No constipation. Genitourinary: Negative for dysuria. Musculoskeletal: Negative for back pain and neck pain. Skin: Large skin tear on back of left hand, smaller tear on left little finger.   Small abrasion on left side of head Neurological: Negative for headaches, focal weakness or numbness.  10-point ROS otherwise negative.  ____________________________________________   PHYSICAL EXAM:  VITAL SIGNS: ED Triage Vitals  Enc Vitals Group     BP 02/16/16 0046 (!) 158/95     Pulse Rate 02/16/16 0046 69     Resp --      Temp 02/16/16 0045 97.8 F (36.6 C)     Temp Source 02/16/16 0045 Oral     SpO2 02/16/16 0046 100 %     Weight --      Height --      Head Circumference --      Peak Flow --      Pain Score --      Pain Loc --      Pain Edu? --      Excl. in [place]? --     Constitutional: Alert and oriented. Elderly but generally well appearing and in no acute distress. Eyes: Conjunctivae are normal. PERRL. EOMI. Head: Small abrasion to left side of head with very small associated hematoma.  No gross deformity, no tenderness to palpation.  Nose: No congestion/rhinnorhea. Mouth/Throat: Mucous membranes are moist.  Oropharynx non-erythematous. Neck: No stridor.  No meningeal signs.  No cervical spine tenderness to palpation. Cardiovascular: Normal rate, regular rhythm. Good peripheral circulation. Grossly normal heart sounds. Respiratory: Normal respiratory  effort.  No retractions. Lungs CTAB. Gastrointestinal: Soft and nontender. No distention.  Musculoskeletal/Skin: Small skin tear to left little finger.  No gross deformities to suggest bony injury.  Large skin tear to the back of left hand as documented with a photo in Media tab of Chart Review.  No bleeding, no vascular injury, no tendon injury.  Even though it is an impressive skin avulsion, it is relatively superficial with no deep tissue involvement.  No bony tenderness to palpation. Neurologic:  Normal speech and language. No gross focal neurologic deficits are appreciated. Good strength in major muscle groups. Psychiatric: Mood and affect are normal. Speech  and behavior are normal.  ____________________________________________   LABS (all labs ordered are listed, but only abnormal results are displayed)  Labs Reviewed - No data to display ____________________________________________  EKG  None - EKG not ordered by ED physician ____________________________________________  RADIOLOGY   No results found.  ____________________________________________   PROCEDURES  Procedure(s) performed:   Marland KitchenMarland KitchenLaceration Repair Date/Time: 02/16/2016 2:32 AM Performed by: Hinda Kehr Authorized by: Hinda Kehr   Consent:    Consent obtained:  Verbal   Consent given by:  Patient Anesthesia (see MAR for exact dosages):    Anesthesia method:  None Laceration details:    Location:  Hand   Hand location:  L hand, dorsum   Length (cm):  7   Depth (mm):  1 Repair type:    Repair type:  Simple Exploration:    Wound exploration: entire depth of wound probed and visualized     Contaminated: no   Treatment:    Visualized foreign bodies/material removed: no   Skin repair:    Repair method:  Steri-Strips Approximation:    Approximation:  Loose   Vermilion border: well-aligned   Post-procedure details:    Dressing:  Bulky dressing   Patient tolerance of procedure:  Tolerated well, no  immediate complications .Marland KitchenLaceration Repair Date/Time: 02/16/2016 2:33 AM Performed by: Hinda Kehr Authorized by: Hinda Kehr   Consent:    Consent obtained:  Verbal   Consent given by:  Patient Anesthesia (see MAR for exact dosages):    Anesthesia method:  None Laceration details:    Location:  Finger   Finger location:  L small finger   Length (cm):  1   Depth (mm):  1 Repair type:    Repair type:  Simple Exploration:    Wound exploration: entire depth of wound probed and visualized     Contaminated: no   Treatment:    Visualized foreign bodies/material removed: no   Skin repair:    Repair method:  Tissue adhesive and Steri-Strips Approximation:    Approximation:  Loose   Vermilion border: well-aligned   Post-procedure details:    Patient tolerance of procedure:  Tolerated well, no immediate complications     Critical Care performed: No ____________________________________________   INITIAL IMPRESSION / ASSESSMENT AND PLAN / ED COURSE  Pertinent labs & imaging results that were available during my care of the patient were reviewed by me and considered in my medical decision making (see chart for details).  In spite of being 82 years old and having a documented baseline functional status of "confused but conversant", he is alert and oriented during my discussion with him.  He states no pain including no headache or neck pain.  I understand that he is on Xarelto but I think he is at very low risk for an acute intracranial injury/hemorrhage.  I even discussed with him and ask him his opinion since he has had multiple falls and he does not want any imaging at this time and I think that is appropriate. I will steri-strip his wounds since sutures are likely to rip through his thin skin.  ____________________________________________  FINAL CLINICAL IMPRESSION(S) / ED DIAGNOSES  Final diagnoses:  Fall, initial encounter  Abrasion of scalp, initial encounter  Minor  head injury, initial encounter  Multiple skin tears     MEDICATIONS GIVEN DURING THIS VISIT:  Medications - No data to display   NEW OUTPATIENT MEDICATIONS STARTED DURING THIS VISIT:  New Prescriptions   No medications on file  Modified Medications   No medications on file    Discontinued Medications   CALCIUM CARBONATE (OS-CAL) 600 MG TABS TABLET    Take by mouth.   CYANOCOBALAMIN 1000 MCG TABLET    Take by mouth.   FOLIC ACID (FOLVITE) [id number] MCG TABLET    Take by mouth.     Note:  This document was prepared using Dragon voice recognition software and may include unintentional dictation errors.    Hinda Kehr, MD 02/16/16 [phone]

## 2016-02-16 NOTE — ED Triage Notes (Signed)
Pt presents after unwitnessed fall. Pt reports he lost his balance from turning too fast. Pt has small abrasion to L temple and bandage to L hand where he has a skin tear. Pt

## 2016-02-16 NOTE — Discharge Instructions (Signed)
You have been seen in the Emergency Department (ED) today for a fall.  Your work up does not show any concerning injuries.  The skin tears on the back of your left hand and on your left little finger were repaired with adhesive strips.  Please keep them clean and dry for as long as possible and the strips should follow off in about one week.  Allow them to follow off on their own rather than pulling them off early, but if they do come off on their own do not try to put them back on.  Please see your doctor in several days for a follow-up visit.  Please take over-the-counter ibuprofen and/or Tylenol as needed for your pain (unless you have an allergy or your doctor as told you not to take them), or take any prescribed medication as instructed.  Please follow up with your doctor regarding today's Emergency Department (ED) visit and your recent fall.    Return to the ED if you have any headache, confusion, slurred speech, weakness/numbness of any arm or leg, or any increased pain.

## 2016-02-16 NOTE — ED Notes (Signed)
Left hand wrapped in order to keep wound clean and dry per MD.

## 2016-03-03 DIAGNOSIS — H01009 Unspecified blepharitis unspecified eye, unspecified eyelid: Secondary | ICD-10-CM | POA: Diagnosis not present

## 2016-03-11 DIAGNOSIS — B351 Tinea unguium: Secondary | ICD-10-CM | POA: Diagnosis not present

## 2016-05-11 DIAGNOSIS — B351 Tinea unguium: Secondary | ICD-10-CM

## 2016-05-15 DIAGNOSIS — M545 Low back pain: Secondary | ICD-10-CM | POA: Diagnosis not present

## 2016-05-20 DIAGNOSIS — M549 Dorsalgia, unspecified: Secondary | ICD-10-CM | POA: Diagnosis not present

## 2016-05-20 DIAGNOSIS — I4891 Unspecified atrial fibrillation: Secondary | ICD-10-CM

## 2016-05-20 DIAGNOSIS — G309 Alzheimer's disease, unspecified: Secondary | ICD-10-CM | POA: Diagnosis not present

## 2016-05-20 DIAGNOSIS — C61 Malignant neoplasm of prostate: Secondary | ICD-10-CM | POA: Diagnosis not present

## 2016-05-25 ENCOUNTER — Telehealth: Payer: Self-pay

## 2016-05-25 NOTE — Telephone Encounter (Signed)
PLEASE NOTE: All timestamps contained within this report are represented as [place] Standard Time. CONFIDENTIALTY NOTICE: This fax transmission is intended only for the addressee. It contains information that is legally privileged, confidential or otherwise protected from use or disclosure. If you are not the intended recipient, you are strictly prohibited from reviewing, disclosing, copying using or disseminating any of this information or taking any action in reliance on or regarding this information. If you have received this fax in error, please notify us immediately by telephone so that we can arrange for its return to [place]. Phone: [phone], Toll-Free: [phone], Fax: [phone] Page: 1 of 1 Call Id: [id number] [place] Night - Client Nonclinical Telephone Record [place] Night - Client Client Site [place] Physician Viviana Simpler - MD Contact Type Call Who Is Calling Physician / Provider / Hospital Call Type Provider Call [facility] Page Now Reason for Call Request to speak to Physician Initial Comment Caller states she is an [facility]. a patient is in alot of pain and she wants to talk to an on call. Additional Comment Patient Name Kerry [name] Patient DOB 10/12/1918 Requesting Provider Suanne Marker Physician Number [phone] Facility Name [facility] Paging DoctorName Phone DateTime Result/Outcome Message Type Notes Crissie Sickles - MD [id number] 05/23/2016 3:51:13 PM Paged On Call Back to Call Center Doctor Paged Please call [name] with [facility] [employer] at [phone] Crissie Sickles - MD 05/23/2016 3:56:16 PM Spoke with On Call - General Message Result Connected on call with caller. Call Closed By: Renato Shin Transaction Date/Time: 05/23/2016 3:21:17 PM (ET)

## 2016-05-25 NOTE — Telephone Encounter (Signed)
Addressed the issue today Will check x-ray Changed tramadol to 25-50mg  q6 hr prn Had some hematuria but nothing else to suggest [place]---will consider ultrasound if persists though

## 2016-06-11 DIAGNOSIS — C7951 Secondary malignant neoplasm of bone: Secondary | ICD-10-CM | POA: Diagnosis not present

## 2016-06-11 DIAGNOSIS — Z8546 Personal history of malignant neoplasm of prostate: Secondary | ICD-10-CM | POA: Diagnosis not present

## 2016-06-18 DIAGNOSIS — R2241 Localized swelling, mass and lump, right lower limb: Secondary | ICD-10-CM | POA: Diagnosis not present

## 2016-07-06 DIAGNOSIS — G629 Polyneuropathy, unspecified: Secondary | ICD-10-CM | POA: Diagnosis not present

## 2016-07-06 DIAGNOSIS — I481 Persistent atrial fibrillation: Secondary | ICD-10-CM | POA: Diagnosis not present

## 2016-07-06 DIAGNOSIS — R609 Edema, unspecified: Secondary | ICD-10-CM | POA: Diagnosis not present

## 2016-07-06 DIAGNOSIS — G301 Alzheimer's disease with late onset: Secondary | ICD-10-CM | POA: Diagnosis not present

## 2016-07-06 DIAGNOSIS — Z8546 Personal history of malignant neoplasm of prostate: Secondary | ICD-10-CM | POA: Diagnosis not present

## 2016-07-21 ENCOUNTER — Telehealth: Payer: Self-pay | Admitting: Family Medicine

## 2016-07-21 NOTE — Telephone Encounter (Signed)
Called Pt to schedule AWV with [facility] - knb °

## 2016-08-12 DIAGNOSIS — H1031 Unspecified acute conjunctivitis, right eye: Secondary | ICD-10-CM | POA: Diagnosis not present

## 2016-08-19 DIAGNOSIS — I872 Venous insufficiency (chronic) (peripheral): Secondary | ICD-10-CM | POA: Diagnosis not present

## 2016-08-19 DIAGNOSIS — L03115 Cellulitis of right lower limb: Secondary | ICD-10-CM | POA: Diagnosis not present

## 2016-08-28 DIAGNOSIS — R600 Localized edema: Secondary | ICD-10-CM | POA: Diagnosis not present

## 2016-08-28 DIAGNOSIS — S81801A Unspecified open wound, right lower leg, initial encounter: Secondary | ICD-10-CM

## 2016-09-11 DIAGNOSIS — Z8546 Personal history of malignant neoplasm of prostate: Secondary | ICD-10-CM | POA: Diagnosis not present

## 2016-09-11 DIAGNOSIS — I4891 Unspecified atrial fibrillation: Secondary | ICD-10-CM | POA: Diagnosis not present

## 2016-09-11 DIAGNOSIS — G909 Disorder of the autonomic nervous system, unspecified: Secondary | ICD-10-CM | POA: Diagnosis not present

## 2016-09-11 DIAGNOSIS — I872 Venous insufficiency (chronic) (peripheral): Secondary | ICD-10-CM | POA: Diagnosis not present

## 2016-09-11 DIAGNOSIS — G309 Alzheimer's disease, unspecified: Secondary | ICD-10-CM | POA: Diagnosis not present

## 2016-10-05 DIAGNOSIS — F39 Unspecified mood [affective] disorder: Secondary | ICD-10-CM | POA: Diagnosis not present

## 2016-10-12 DIAGNOSIS — F39 Unspecified mood [affective] disorder: Secondary | ICD-10-CM | POA: Diagnosis not present

## 2016-10-28 DEATH — deceased

## 2018-02-10 IMAGING — CT CT HEAD W/O CM
3 series · 15 of 47 positions shown, 18 images · non-contrast
Comparison: None.

CLINICAL DATA: Pt presents to ED with reports of falling off the
bed today and hitting his head. Pt states was sitting on bed and
slipped off hitting the left side of his head. Pt denies LOC. Pt is
a resident of [REDACTED] [REDACTED]. Pt c...*comment was
truncated*

EXAM:
CT HEAD WITHOUT CONTRAST
TECHNIQUE: Contiguous axial images were obtained from the base of the skull
through the vertex without intravenous contrast.

[Series 2: head wo · axial · 0.42mm/px · z∈[-158,-28]mm · 9 of 32 slices shown, 12 images]
[im 3/32  brain]
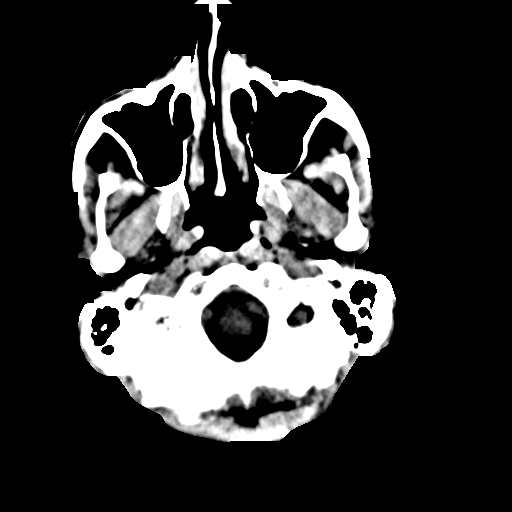
[im 3/32  bone]
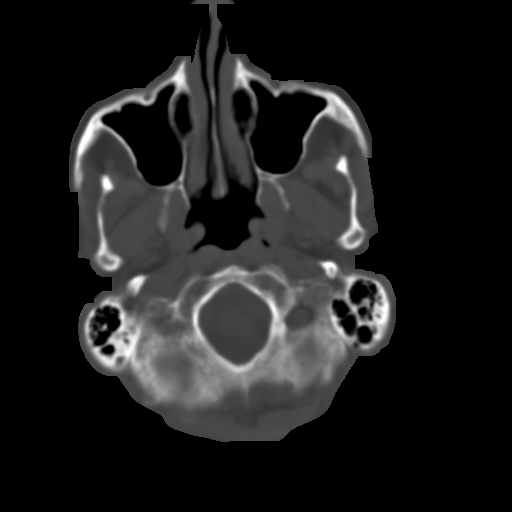
[im 6/32  brain]
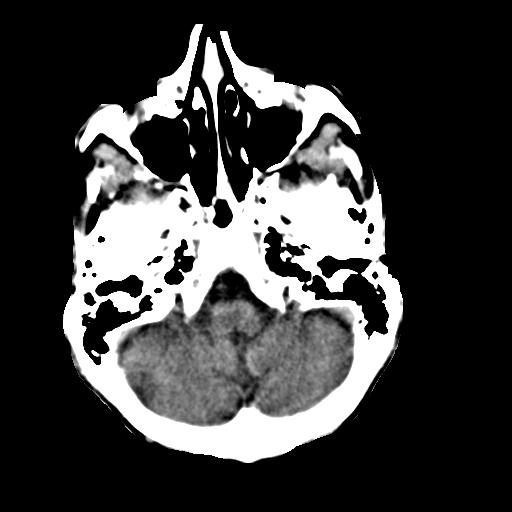
[im 9/32  brain]
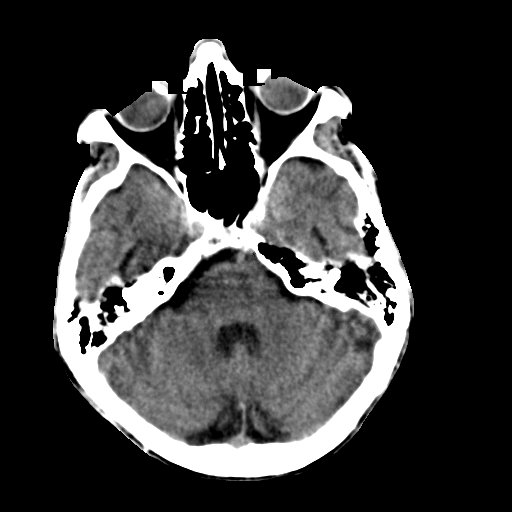
[im 12/32  brain]
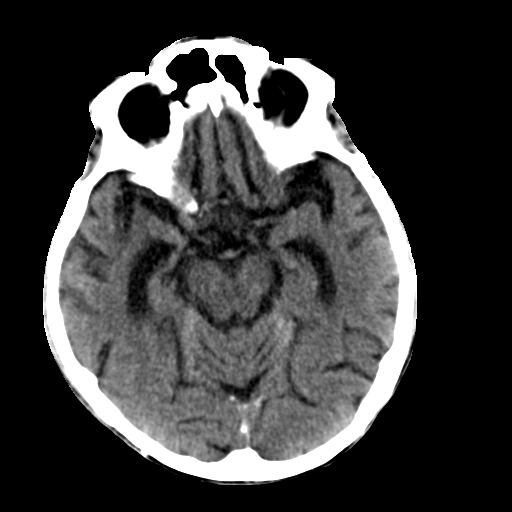
[im 17/32  brain]
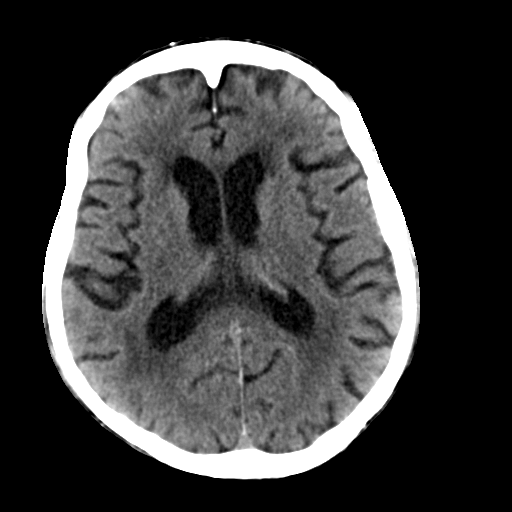
[im 17/32  bone]
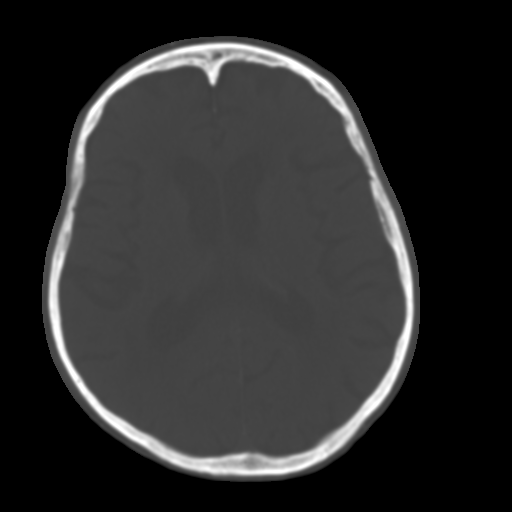
[im 20/32  brain]
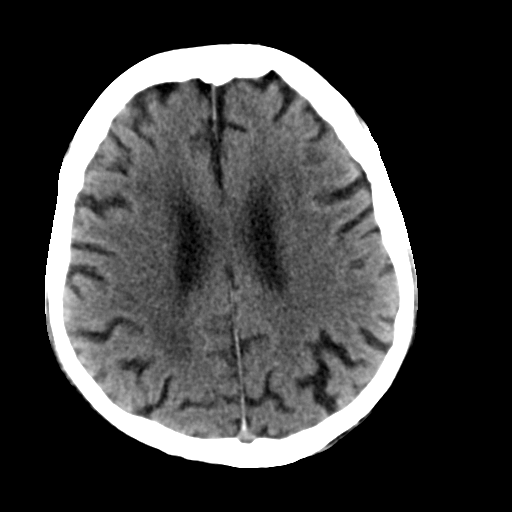
[im 23/32  brain]
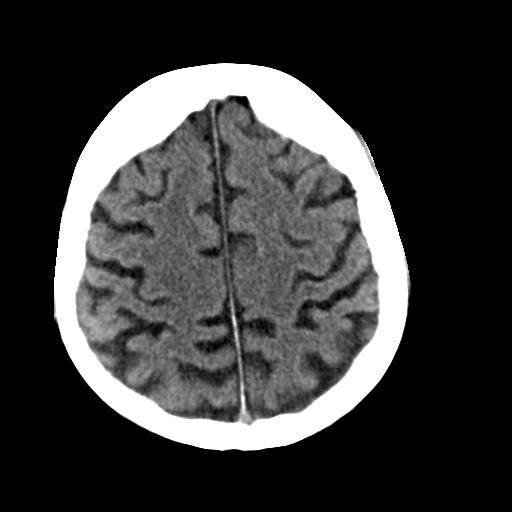
[im 26/32  brain]
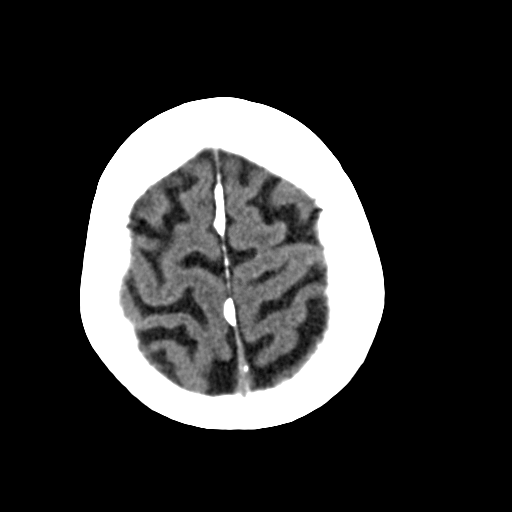
[im 29/32  brain]
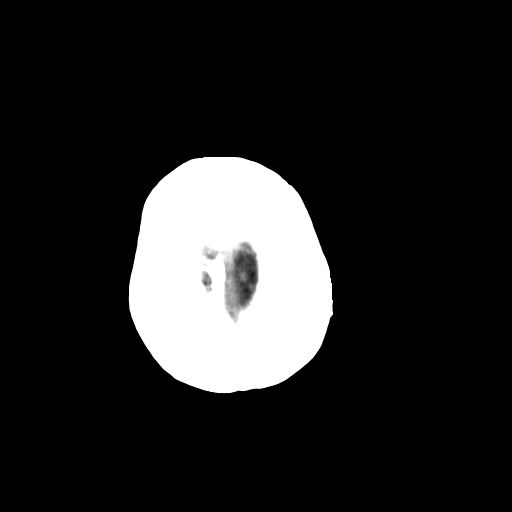
[im 29/32  bone]
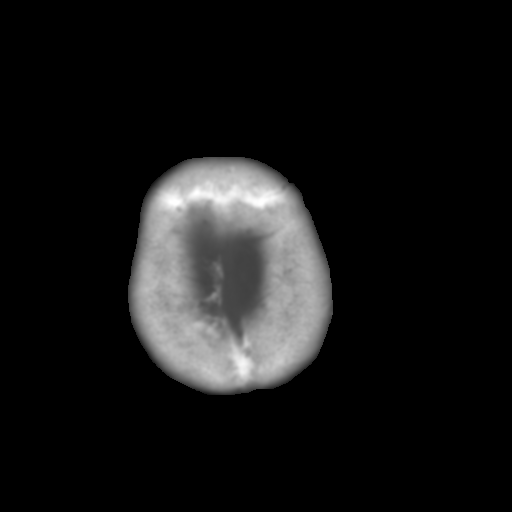

[Series 4: coronal soft tissue · coronal · 0.30mm/px · 3 of 63 slices shown]
[im 21/63  brain]
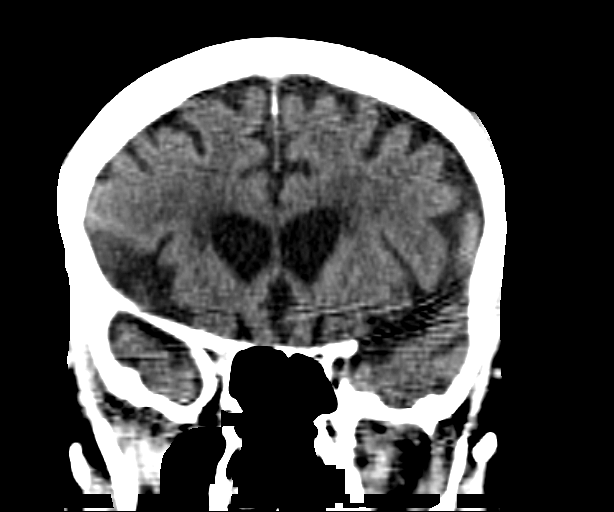
[im 28/63  brain]
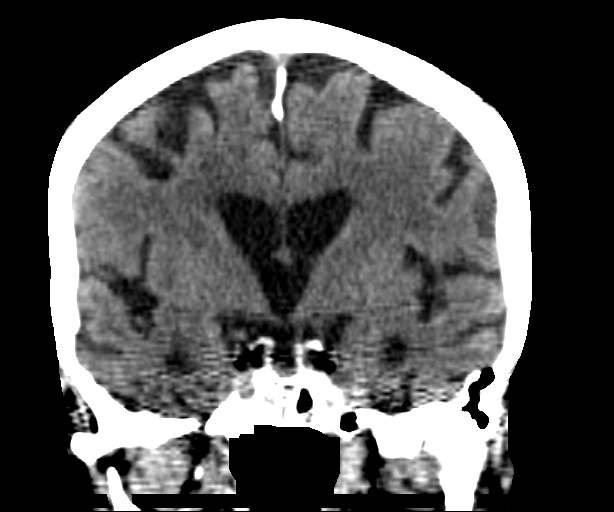
[im 35/63  brain]
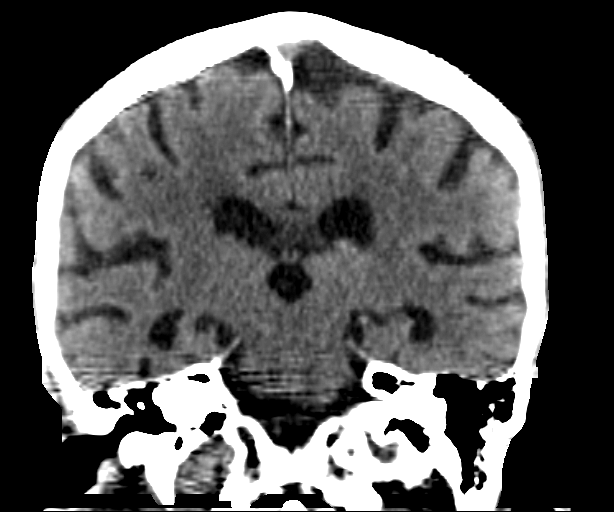

[Series 5: sagittal soft tissue · sagittal · 0.33mm/px · 3 of 53 slices shown]
[im 18/53  brain]
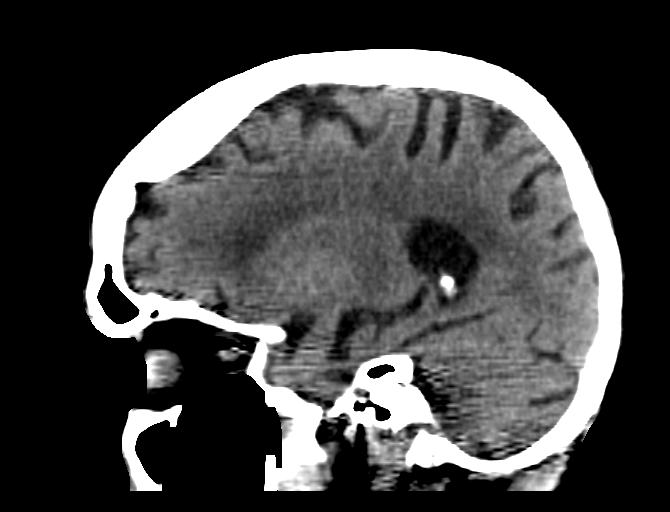
[im 27/53  brain]
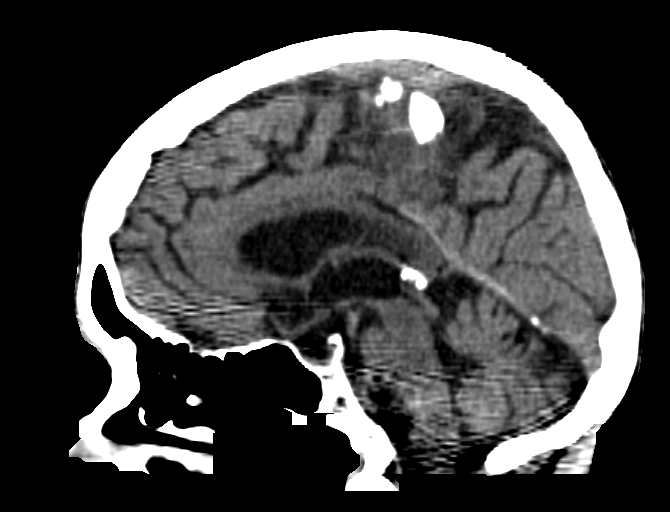
[im 35/53  brain]
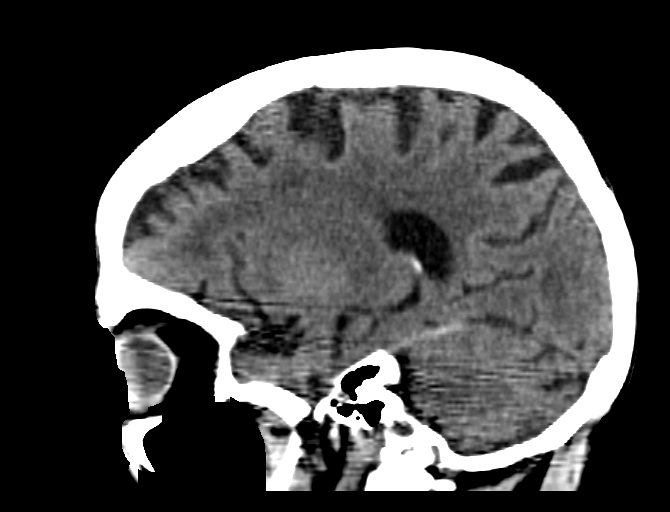

[15 of 47 positions shown; findings below may reference images not displayed]

FINDINGS: No intracranial hemorrhage. No parenchymal contusion. No midline
shift or mass effect. Basilar cisterns are patent. No skull base
fracture. No fluid in the paranasal sinuses or mastoid air cells.
Orbits are normal.

There are periventricular and subcortical white matter
hypodensities. Generalized cortical atrophy.

Scalp hematoma over the LEFT frontal bone.  No fracture.
IMPRESSION: 1. No intracranial trauma.
2. Shallow scalp hematoma over the LEFT frontal bone.
3. Atrophy and white matter microvascular disease.
